# Patient Record
Sex: Female | Born: 1955
Health system: Southern US, Community
[De-identification: ages and names within clinical notes are randomized; demographics above are authoritative.]

## PROBLEM LIST (undated history)

## (undated) DIAGNOSIS — J40 Bronchitis, not specified as acute or chronic: Secondary | ICD-10-CM

## (undated) DIAGNOSIS — C801 Malignant (primary) neoplasm, unspecified: Secondary | ICD-10-CM

## (undated) DIAGNOSIS — D0510 Intraductal carcinoma in situ of unspecified breast: Secondary | ICD-10-CM

## (undated) DIAGNOSIS — I10 Essential (primary) hypertension: Secondary | ICD-10-CM

## (undated) DIAGNOSIS — Q210 Ventricular septal defect: Secondary | ICD-10-CM

## (undated) DIAGNOSIS — I519 Heart disease, unspecified: Secondary | ICD-10-CM

## (undated) DIAGNOSIS — L509 Urticaria, unspecified: Secondary | ICD-10-CM

## (undated) HISTORY — DX: Heart disease, unspecified: I51.9

## (undated) HISTORY — PX: BREAST SURGERY: SHX581

## (undated) HISTORY — DX: Bronchitis, not specified as acute or chronic: J40

## (undated) HISTORY — DX: Urticaria, unspecified: L50.9

## (undated) HISTORY — PX: CARDIAC CATHETERIZATION: SHX172

## (undated) HISTORY — PX: MOUTH SURGERY: SHX715

## (undated) HISTORY — DX: Ventricular septal defect: Q21.0

## (undated) HISTORY — DX: Malignant (primary) neoplasm, unspecified: C80.1

## (undated) HISTORY — DX: Intraductal carcinoma in situ of unspecified breast: D05.10

## (undated) HISTORY — DX: Essential (primary) hypertension: I10

---

## 1998-08-28 ENCOUNTER — Other Ambulatory Visit: Admission: RE | Admit: 1998-08-28 | Discharge: 1998-08-28 | Payer: Self-pay | Admitting: Obstetrics and Gynecology

## 2000-09-02 ENCOUNTER — Ambulatory Visit (HOSPITAL_COMMUNITY)
Admission: RE | Admit: 2000-09-02 | Discharge: 2000-09-02 | Payer: Self-pay | Admitting: Physical Medicine and Rehabilitation

## 2000-09-02 ENCOUNTER — Encounter: Payer: Self-pay | Admitting: Physical Medicine and Rehabilitation

## 2000-10-18 ENCOUNTER — Encounter: Admission: RE | Admit: 2000-10-18 | Discharge: 2000-12-16 | Payer: Self-pay | Admitting: Orthopedic Surgery

## 2001-04-28 ENCOUNTER — Other Ambulatory Visit: Admission: RE | Admit: 2001-04-28 | Discharge: 2001-04-28 | Payer: Self-pay | Admitting: Gynecology

## 2002-01-17 ENCOUNTER — Encounter: Payer: Self-pay | Admitting: Family Medicine

## 2002-01-17 ENCOUNTER — Encounter: Admission: RE | Admit: 2002-01-17 | Discharge: 2002-01-17 | Payer: Self-pay | Admitting: Family Medicine

## 2002-01-26 HISTORY — PX: TOTAL HIP ARTHROPLASTY: SHX124

## 2003-04-10 ENCOUNTER — Encounter: Admission: RE | Admit: 2003-04-10 | Discharge: 2003-04-10 | Payer: Self-pay | Admitting: Family Medicine

## 2006-06-11 ENCOUNTER — Other Ambulatory Visit: Admission: RE | Admit: 2006-06-11 | Discharge: 2006-06-11 | Payer: Self-pay | Admitting: Gynecology

## 2009-01-26 DIAGNOSIS — D0510 Intraductal carcinoma in situ of unspecified breast: Secondary | ICD-10-CM

## 2009-01-26 HISTORY — DX: Intraductal carcinoma in situ of unspecified breast: D05.10

## 2009-07-18 ENCOUNTER — Other Ambulatory Visit: Admission: RE | Admit: 2009-07-18 | Discharge: 2009-07-18 | Payer: Self-pay | Admitting: Gynecology

## 2009-07-18 ENCOUNTER — Ambulatory Visit: Payer: Self-pay | Admitting: Gynecology

## 2009-11-14 ENCOUNTER — Ambulatory Visit (HOSPITAL_COMMUNITY): Admission: RE | Admit: 2009-11-14 | Discharge: 2009-11-14 | Payer: Self-pay | Admitting: General Surgery

## 2009-12-10 ENCOUNTER — Ambulatory Visit: Payer: Self-pay | Admitting: Oncology

## 2010-04-09 LAB — COMPREHENSIVE METABOLIC PANEL
ALT: 18 U/L (ref 0–35)
AST: 17 U/L (ref 0–37)
Albumin: 4.2 g/dL (ref 3.5–5.2)
Alkaline Phosphatase: 67 U/L (ref 39–117)
BUN: 12 mg/dL (ref 6–23)
CO2: 28 mEq/L (ref 19–32)
Calcium: 9.7 mg/dL (ref 8.4–10.5)
Chloride: 104 mEq/L (ref 96–112)
Creatinine, Ser: 0.74 mg/dL (ref 0.4–1.2)
GFR calc Af Amer: >60 mL/min (ref >60)
GFR calc non Af Amer: >60 mL/min (ref >60)
Glucose, Bld: 92 mg/dL (ref 70–99)
Potassium: 4.1 mEq/L (ref 3.5–5.1)
Sodium: 141 mEq/L (ref 135–145)
Total Bilirubin: 0.7 mg/dL (ref 0.3–1.2)
Total Protein: 7.2 g/dL (ref 6.0–8.3)

## 2010-04-09 LAB — CBC
HCT: 42.7 % (ref 36.0–46.0)
Hemoglobin: 14.2 g/dL (ref 12.0–15.0)
MCH: 28.5 pg (ref 26.0–34.0)
MCHC: 33.3 g/dL (ref 30.0–36.0)
MCV: 85.7 fL (ref 78.0–100.0)
Platelets: 232 10*3/uL (ref 150–400)
RBC: 4.98 MIL/uL (ref 3.87–5.11)
RDW: 14.5 % (ref 11.5–15.5)
WBC: 8.2 10*3/uL (ref 4.0–10.5)

## 2010-04-09 LAB — DIFFERENTIAL
Basophils Absolute: 0 10*3/uL (ref 0.0–0.1)
Lymphocytes Relative: 33 % (ref 12–46)
Monocytes Absolute: 0.7 10*3/uL (ref 0.1–1.0)
Monocytes Relative: 9 % (ref 3–12)
Neutro Abs: 4.4 10*3/uL (ref 1.7–7.7)

## 2010-04-09 LAB — SURGICAL PCR SCREEN
MRSA, PCR: NEGATIVE
Staphylococcus aureus: NEGATIVE

## 2010-04-09 LAB — PROTIME-INR: INR: 0.97 (ref 0.00–1.49)

## 2010-07-15 ENCOUNTER — Encounter (INDEPENDENT_AMBULATORY_CARE_PROVIDER_SITE_OTHER): Payer: Self-pay | Admitting: General Surgery

## 2010-07-21 ENCOUNTER — Encounter: Payer: Self-pay | Admitting: Gynecology

## 2010-07-23 ENCOUNTER — Encounter: Payer: Self-pay | Admitting: Gynecology

## 2010-07-29 ENCOUNTER — Other Ambulatory Visit: Payer: Self-pay | Admitting: Gynecology

## 2010-07-29 ENCOUNTER — Encounter (INDEPENDENT_AMBULATORY_CARE_PROVIDER_SITE_OTHER): Payer: PRIVATE HEALTH INSURANCE | Admitting: Gynecology

## 2010-07-29 ENCOUNTER — Other Ambulatory Visit (HOSPITAL_COMMUNITY)
Admission: RE | Admit: 2010-07-29 | Discharge: 2010-07-29 | Disposition: A | Discharge status: Home / Self Care | Payer: PRIVATE HEALTH INSURANCE | Source: Ambulatory Visit | Attending: Gynecology | Admitting: Gynecology

## 2010-07-29 DIAGNOSIS — Z124 Encounter for screening for malignant neoplasm of cervix: Secondary | ICD-10-CM | POA: Insufficient documentation

## 2010-07-29 DIAGNOSIS — B373 Candidiasis of vulva and vagina: Secondary | ICD-10-CM

## 2010-07-29 DIAGNOSIS — R82998 Other abnormal findings in urine: Secondary | ICD-10-CM

## 2010-07-29 DIAGNOSIS — Z01419 Encounter for gynecological examination (general) (routine) without abnormal findings: Secondary | ICD-10-CM

## 2010-08-04 ENCOUNTER — Encounter (INDEPENDENT_AMBULATORY_CARE_PROVIDER_SITE_OTHER): Payer: Self-pay | Admitting: General Surgery

## 2010-08-06 ENCOUNTER — Encounter (INDEPENDENT_AMBULATORY_CARE_PROVIDER_SITE_OTHER): Payer: Self-pay | Admitting: General Surgery

## 2010-08-06 ENCOUNTER — Other Ambulatory Visit (INDEPENDENT_AMBULATORY_CARE_PROVIDER_SITE_OTHER): Payer: PRIVATE HEALTH INSURANCE

## 2010-08-06 ENCOUNTER — Ambulatory Visit (INDEPENDENT_AMBULATORY_CARE_PROVIDER_SITE_OTHER): Payer: PRIVATE HEALTH INSURANCE | Admitting: General Surgery

## 2010-08-06 DIAGNOSIS — D0500 Lobular carcinoma in situ of unspecified breast: Secondary | ICD-10-CM | POA: Insufficient documentation

## 2010-08-06 DIAGNOSIS — D0590 Unspecified type of carcinoma in situ of unspecified breast: Secondary | ICD-10-CM

## 2010-08-06 DIAGNOSIS — R799 Abnormal finding of blood chemistry, unspecified: Secondary | ICD-10-CM

## 2010-08-06 NOTE — Progress Notes (Signed)
She is status post right lumpectomy for lobular carcinoma in situ. I referred her over to the cancer center to the high-risk breast cancer clinic back in October 2011.  However she did not keep her appointment. She did not understand what they were going to tell her. She states that they kept asking her questions about her insurance. She called and made a followup appointment with [place].  She states she does not feel any masses in her breast. Occasionally she has some discomfort at the right breast scar site.  Exam: Right breast is soft with a well-healed upper outer quadrant scar. No dominant masses, nipple discharge, or suspicious skin changes.  The left breast is soft without dominant masses, suspicious skin changes, or nipple discharge.  Nodes: No palpable cervical, supraclavicular, or axillary adenopathy.  Assessment: Lobular carcinoma in situ of right breast. This places her into the high risk group for breast cancer. I explained the reason for referring her over to that clinic. She seems to understand.  Plan: Referral to high-risk breast cancer clinic. Return visit here p.r.n.

## 2010-08-06 NOTE — Patient Instructions (Signed)
Keep appointment with Oncology clinic.

## 2010-09-04 ENCOUNTER — Ambulatory Visit (INDEPENDENT_AMBULATORY_CARE_PROVIDER_SITE_OTHER): Payer: PRIVATE HEALTH INSURANCE | Admitting: Gynecology

## 2010-09-04 ENCOUNTER — Other Ambulatory Visit: Payer: Self-pay

## 2010-09-04 DIAGNOSIS — Z13820 Encounter for screening for osteoporosis: Secondary | ICD-10-CM

## 2010-09-05 ENCOUNTER — Other Ambulatory Visit: Payer: PRIVATE HEALTH INSURANCE

## 2010-10-14 ENCOUNTER — Encounter: Payer: Self-pay | Admitting: Gynecology

## 2011-01-01 ENCOUNTER — Ambulatory Visit: Payer: PRIVATE HEALTH INSURANCE

## 2011-01-01 ENCOUNTER — Ambulatory Visit (HOSPITAL_BASED_OUTPATIENT_CLINIC_OR_DEPARTMENT_OTHER): Payer: PRIVATE HEALTH INSURANCE | Admitting: Family

## 2011-01-01 ENCOUNTER — Telehealth: Payer: Self-pay | Admitting: *Deleted

## 2011-01-01 DIAGNOSIS — D0590 Unspecified type of carcinoma in situ of unspecified breast: Secondary | ICD-10-CM

## 2011-01-01 DIAGNOSIS — D6859 Other primary thrombophilia: Secondary | ICD-10-CM

## 2011-01-01 DIAGNOSIS — D0500 Lobular carcinoma in situ of unspecified breast: Secondary | ICD-10-CM

## 2011-01-01 DIAGNOSIS — Z803 Family history of malignant neoplasm of breast: Secondary | ICD-10-CM

## 2011-01-01 DIAGNOSIS — E669 Obesity, unspecified: Secondary | ICD-10-CM

## 2011-01-01 NOTE — Telephone Encounter (Signed)
gave patient appointment for Rebecca Stephens 01-05-2011 at 10:30am, gave patient appointment for three months with nancy rudolph 04-02-2011 printed out calendar and gave to the patient

## 2011-01-01 NOTE — Progress Notes (Signed)
[facility]  High Risk Clinic New Patient Evaluation  Name: Rebecca Stephens            Date: 01/01/2011 MRN: [id number]                DOB: 03/12/1955  CC: Catha Gosselin, MD        Avel Peace, MD  REFERRING PHYSICIAN: Avel Peace, MD  REASON FOR VISIT: Assessment for high risk, breast cancer.   HISTORY OF PRESENT ILLNESS: Rebecca Stephens is a 55 y.o. female here for cancer risk assessment. She presents to the clinic with for counseling and evaluation and discussion regarding the implication for her own healthcare and for her family. Previous breast/ovarian biopsies: Core needle biopsy 09/2009, right breast: Complex sclerosing lesion with hyperplasia and focal atypia and lobular carcinoma in situ. Surgical excision 10/2009, right breast: Focal atypical ductal hyperplasia with radial scar, no carcinoma.   PAST MEDICAL HISTORY: Congenital ventricular septal defect, required observation only. Superficial thrombophlebitis while on birth control age 18. No prior thoracic irradiation.   PAST SURGICAL HISTORY:  Procedure Date  . Total hip arthroplasty 2004    right hip  . Cardiac catheterization   . Mouth surgery     bone removed from under tongue  . Breast surgery 2011    Right lumpectomy: lobular carcinoma in situ, completely excised.       CURRENT MEDICATIONS: Aspirin 81 mg. daily, Fish Oil capsule daily, Advair, 2 puffs daily.   ALLERGIES: No known allergies.  SOCIAL HISTORY: Never smoked. Married, 2 children, youngest will attend college this year. 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   HEALTH HABITS: Supplements: Fish Oil  Alternative Therapies: None Adverse environmental exposure: None Exercises regularly: no Nonsmoker Alcohol yes, 2 glasses wine/week BMI 38.  REPRODUCTIVE HISTORY:  Menarche age 32 Grav 3 Para 3 FLB age 45 Number of live births: 2 Took fertility meds: No Menses: absent for 1 year Oral Contraceptives:   Yes, age 60 for 4 months Menopause: natural  Age  23 HRT: No, never  ETHNICITY: [place], [place], [place].    FAMILY HISTORY: Breast cancer in maternal grandmother; colon cancer in maternal grandfather. Clotting disorder in her father; and stroke or MI in her paternal grandfather and paternal grandmother. Maternal grandfather with sarcoma, lower extremity. No ovarian cancer. No family history of thyroid cancer, adrenocortical carcinoma, endometrial cancer, pancreatic cancer, brain tumors, diffuse gastric cancer, leukemia/lymphoma. No female breast cancer.   HEALTH MAINTENANCE: Last mammogram: Dec. 2011 Last clinical breast exam: 6 months ago Performs self breast exam: No Colonoscopy: August, 2011: Tubular adenoma and hyperplastic polyp, no high grade dysplasia or malignancy.   REVIEW OF SYSTEMS: General: Negative for fever, chills, night sweats,  loss of appetite or weight loss. HEENT: Negative for headaches, sore  throat, difficulty swallowing, blurred vision or problem with hearing or  sinus congestion. Respiratory: Negative for shortness of breath, cough  or dyspnea on exertion. Cardiovascular: Negative for chest pain,  palpitations or pedal edema. GI: Negative for nausea, vomiting,  diarrhea, constipation, change in bowel habits or blood in the stool.  No jaundice. GU: Negative for painful or frequent urination, change in  color of urine, or decreased urinary stream. Integumentary: Negative  for skin rashes or other suspicious skin lesions. Hematologic: Negative  for easy bruisability or bleeding. Musculoskeletal: Negative for  complaints of pain, arthralgias, arthritis or myalgias.  Neurological/psychiatric: Negative for numbness, focal weakness,  balance problems or coordination difficulties. No  depression or anxiety. Breast: No self-detected breast complaints.   PHYSICAL EXAM: Wt: 256, B/P 126/79, P 71, T 97.8 GENERAL: Well developed, well nourished, in no acute distress.  EENT:  No ocular or oral lesions. No stomatitis.  RESPIRATORY: Lungs are clear to auscultation bilaterally with normal respiratory movement and no accessory muscle use. CARDIAC: No murmur, rub or tachycardia. No upper or lower extremity edema.  GI: Abdomen is soft, no palpable hepatosplenomegaly. No fluid wave. No tenderness. Musculoskeletal: No kyphosis, no tenderness over the spine, ribs or hips. Lymph: No cervical, infraclavicular, axillary or inguinal adenopathy. Breast: In the supine position, with the right arm over the head, the right nipple is everted. No periareolar edema or nipple discharge. No mass in any quadrant or subareolar region. No redness of the skin. No right axillary adenopathy. Well healted lumpectomy scar at the 9:30 position. No nodularity underlying the scar. With the left arm over the head, the left nipple is everted. No periareolar edema or nipple discharge. No mass in any quadrant or subareolar region. No redness of the skin. No left axillary adenopathy. Prominent inframammary folds bilaterally.  Neuro: No focal neurological deficits. Psych: Alert and oriented X 3, appropriate mood and affect.   ASSESSMENT: 55 y/o white female with: 1. History of right breast lobular carcinoma in situ, Dec. 2011, no evidence of recurrence.  2. Hypercoagulable state with history of superficial thrombophlebitis while on birth control, age 34.  3. Obesity. 4. Due mammography.  PLAN:  1. Mammogram at [facility] (per her request). 2. Lab work (hypercoagulable panel). 3. Nutrition consult for weight loss to reduce breast cancer risks. 4. She will follow-up with cardiology for ventricular septal defect.  5. Return to clinic in 3 months. By that time we will have results of hypercoagulable work-up and we can use that information to weigh risks vs benefits of chemoprevention.   TEACHING: She is insecure about how to perform self breast exam. A detailed procedure is outlined for her with demonstration  and she performs a return demonstration.   DISCUSSION: Risk reduction with the use of chemoprevention is discussed using Tamoxifen or Raloxifene. The possible side effects of both agents were discussed, including hot flashes, vaginal dryness and increased risk of DVT. Increased risk of endometrial cancer and cataracts with Tamoxifen were discussed. The benefits to bone density were outlined for both agents.   Lifestyle modifications such as diet, body weight, exercise and alcohol consumption were discussed. A healthy lifestyle was encouraged and up-todate recommendations for screening and surveillance were stressed, along with the importance of screening for other common cancers (cervical, skin, colon).   A total of 125 minutes was spent with the patient. 100 minutes were spent in counseling regarding risks/benefits of treatments and lifestyle modifications.

## 2011-01-05 ENCOUNTER — Ambulatory Visit: Payer: PRIVATE HEALTH INSURANCE | Admitting: Nutrition

## 2011-01-05 NOTE — Assessment & Plan Note (Signed)
REASON FOR ASSESSMENT:  This is a 55 year old female patient of Dr. Welton Flakes diagnosed with DCIS last year.  MEDICAL HISTORY INCLUDES: 1. Congenital ventricular septal defect. 2. Obesity. 3. Hypertension. 4. Asthma. 5. Bronchitis. 6. Total hip replacement in 2004.  MEDICATIONS INCLUDE:  Aspirin and fish oil.  LABS:  There are no recent labs to review.  HEIGHT:  70 inches. WEIGHT:  256 pounds. BMI:  36.7  PATIENT STATES:  The patient is interested in nutrition information to help reduce her chance of breast cancer recurrence.  NUTRITION DIAGNOSIS:  Food and nutrition related knowledge deficit related to history of breast cancer as evidenced by no prior need for nutrition related information.  INTERVENTION:  I have educated the patient on the importance of increasing a plant-based diet and eating lean protein sources while decreasing total fat in her diet as well as processed foods.  I have encouraged the patient to begin an exercise regimen based on her physician recommendations and eventually to increase to a minimum of 30 minutes, 5 days a week for a total of 150 minutes a week and gradually increase that to 300 minutes a week based on tolerance.  I have encouraged the patient to specifically choose lower fat foods, to decrease overall sugar content of her meal choices, to increase whole fruits and vegetables to approximately 7 servings daily, and to include lean protein sources.  I have provided her with fact sheets and information for her to review on her own.  I have given her some on-line websites for her to do some research on nutrition and to possibly use them to help her in her weight loss journey.  MONITORING/EVALUATION (GOALS):  The patient will tolerate a plant-based diet that is low in fat and high in fruits, vegetables, and whole grains to promote safe weight loss and to reduce the risk for breast cancer recurrence.  NEXT VISIT:  I have not scheduled a followup with the patient.   However, she has my contact information for questions or concerns.    ______________________________ Zenovia Jarred, RD, LDN Clinical Nutrition Specialist BN/MEDQ  D:  01/05/2011  T:  01/05/2011  Job:  [id number]

## 2011-01-08 ENCOUNTER — Telehealth: Payer: Self-pay | Admitting: Oncology

## 2011-01-08 LAB — HYPERCOAGULABLE PANEL, COMPREHENSIVE
Anticardiolipin IgG: 16 GPL U/mL (ref <23)
Beta-2 Glyco I IgG: 0 G Units (ref <20)
DRVVT: 37.3 secs (ref 34.1–42.2)
Lupus Anticoagulant: NOT DETECTED
Protein C Activity: 200 % — ABNORMAL HIGH (ref 75–133)
Protein C, Total: 167 % — ABNORMAL HIGH (ref 72–160)
Protein S Total: 130 % (ref 60–150)

## 2011-01-08 NOTE — Telephone Encounter (Signed)
pt rtn call and confirmed appt for 04/01/2011

## 2011-01-13 ENCOUNTER — Encounter (INDEPENDENT_AMBULATORY_CARE_PROVIDER_SITE_OTHER): Payer: Self-pay | Admitting: General Surgery

## 2011-02-10 ENCOUNTER — Encounter: Payer: Self-pay | Admitting: *Deleted

## 2011-02-10 NOTE — Progress Notes (Signed)
Mailed lab results to pt

## 2011-02-13 ENCOUNTER — Telehealth: Payer: Self-pay | Admitting: Oncology

## 2011-02-13 ENCOUNTER — Other Ambulatory Visit: Payer: Self-pay | Admitting: Oncology

## 2011-02-13 DIAGNOSIS — D6859 Other primary thrombophilia: Secondary | ICD-10-CM

## 2011-02-13 NOTE — Telephone Encounter (Signed)
No note, telephone call only

## 2011-02-14 ENCOUNTER — Telehealth: Payer: Self-pay | Admitting: Oncology

## 2011-02-14 NOTE — Telephone Encounter (Signed)
lmonvm advising the pt of her lab appt on Tuesday 02/17/2011

## 2011-02-17 ENCOUNTER — Other Ambulatory Visit: Payer: PRIVATE HEALTH INSURANCE | Admitting: Lab

## 2011-04-01 ENCOUNTER — Ambulatory Visit: Payer: PRIVATE HEALTH INSURANCE | Admitting: Family

## 2011-04-02 ENCOUNTER — Ambulatory Visit: Payer: PRIVATE HEALTH INSURANCE | Admitting: Family

## 2011-07-16 ENCOUNTER — Encounter (INDEPENDENT_AMBULATORY_CARE_PROVIDER_SITE_OTHER): Payer: Self-pay

## 2011-11-18 ENCOUNTER — Telehealth (INDEPENDENT_AMBULATORY_CARE_PROVIDER_SITE_OTHER): Payer: Self-pay

## 2011-11-18 NOTE — Telephone Encounter (Signed)
Pt called for information from her pathology of 11/19/2009.  Results were read to her.

## 2011-12-10 ENCOUNTER — Other Ambulatory Visit (HOSPITAL_COMMUNITY): Payer: Self-pay

## 2011-12-10 ENCOUNTER — Other Ambulatory Visit (HOSPITAL_COMMUNITY): Payer: Self-pay | Admitting: *Deleted

## 2011-12-10 DIAGNOSIS — Q210 Ventricular septal defect: Secondary | ICD-10-CM

## 2011-12-10 DIAGNOSIS — R079 Chest pain, unspecified: Secondary | ICD-10-CM

## 2011-12-16 ENCOUNTER — Inpatient Hospital Stay (HOSPITAL_COMMUNITY): Admission: RE | Admit: 2011-12-16 | Payer: BC Managed Care – PPO | Source: Ambulatory Visit

## 2011-12-16 ENCOUNTER — Ambulatory Visit (HOSPITAL_COMMUNITY)
Admission: RE | Admit: 2011-12-16 | Discharge: 2011-12-16 | Disposition: A | Discharge status: Home / Self Care | Payer: BC Managed Care – PPO | Source: Ambulatory Visit | Attending: Cardiovascular Disease | Admitting: Cardiovascular Disease

## 2011-12-16 DIAGNOSIS — R002 Palpitations: Secondary | ICD-10-CM | POA: Insufficient documentation

## 2011-12-16 DIAGNOSIS — I079 Rheumatic tricuspid valve disease, unspecified: Secondary | ICD-10-CM | POA: Insufficient documentation

## 2011-12-16 DIAGNOSIS — I510 Cardiac septal defect, acquired: Secondary | ICD-10-CM | POA: Insufficient documentation

## 2011-12-16 DIAGNOSIS — Q210 Ventricular septal defect: Secondary | ICD-10-CM

## 2011-12-16 NOTE — Progress Notes (Signed)
[facility]   2D echo completed 12/16/2011.   Cindy Analissa Bayless, RDCS   

## 2011-12-30 ENCOUNTER — Ambulatory Visit (HOSPITAL_COMMUNITY)
Admission: RE | Admit: 2011-12-30 | Discharge: 2011-12-30 | Disposition: A | Discharge status: Home / Self Care | Payer: BC Managed Care – PPO | Source: Ambulatory Visit | Attending: Cardiovascular Disease | Admitting: Cardiovascular Disease

## 2011-12-30 DIAGNOSIS — Z8249 Family history of ischemic heart disease and other diseases of the circulatory system: Secondary | ICD-10-CM | POA: Insufficient documentation

## 2011-12-30 DIAGNOSIS — R079 Chest pain, unspecified: Secondary | ICD-10-CM | POA: Insufficient documentation

## 2011-12-30 DIAGNOSIS — J45909 Unspecified asthma, uncomplicated: Secondary | ICD-10-CM | POA: Insufficient documentation

## 2011-12-30 DIAGNOSIS — E669 Obesity, unspecified: Secondary | ICD-10-CM | POA: Insufficient documentation

## 2011-12-30 MED ORDER — REGADENOSON 0.4 MG/5ML IV SOLN
0.4000 mg | Freq: Once | INTRAVENOUS | Status: AC
  Administered 2011-12-30: 0.4 mg via INTRAVENOUS

## 2011-12-30 MED ORDER — AMINOPHYLLINE 25 MG/ML IV SOLN
75.0000 mg | Freq: Once | INTRAVENOUS | Status: AC
  Administered 2011-12-30: 75 mg via INTRAVENOUS

## 2011-12-30 MED ORDER — TECHNETIUM TC 99M SESTAMIBI GENERIC - CARDIOLITE
10.5000 | Freq: Once | INTRAVENOUS | Status: AC | PRN
  Administered 2011-12-30: 11 via INTRAVENOUS

## 2011-12-30 MED ORDER — TECHNETIUM TC 99M SESTAMIBI GENERIC - CARDIOLITE
31.0000 | Freq: Once | INTRAVENOUS | Status: AC | PRN
  Administered 2011-12-30: 31 via INTRAVENOUS

## 2011-12-30 NOTE — Procedures (Addendum)
[facility] [address] [place] [address] [place] [address] [phone]  Cardiology Nuclear Med Study  Rebecca Stephens is a 56 y.o. female     MRN : [id number]     DOB: 02-11-1955  Procedure Date: 12/30/2011  Nuclear Med Background Indication for Stress Test:  Evaluation for Ischemia History:  Asthma Cardiac Risk Factors: Family History - CAD and Obesity  Symptoms:  Chest Pain   Nuclear Pre-Procedure Caffeine/Decaff Intake:  9:00pm NPO After: 7:00am   IV Site: R Antecubital  IV 0.9% NS with Angio Cath:  22g  Chest Size (in):  n/a IV Started by: Koren Shiver, CNMT  Height: 5\' 11"  (1.803 m)  Cup Size: B  BMI:  Body mass index is 37.24 kg/(m^2). Weight:  267 lb (121.11 kg)   Tech Comments:  n/a    Nuclear Med Study 1 or 2 day study: 1 day  Stress Test Type:  Lexiscan  Order Authorizing Provider:  Nicki Guadalajara, MD   Resting Radionuclide: Technetium 18m Sestamibi  Resting Radionuclide Dose: 10.5 mCi   Stress Radionuclide:  Technetium 62m Sestamibi  Stress Radionuclide Dose: 31.0 mCi           Stress Protocol Rest HR: 59 Stress HR:98  Rest BP:137/88 Stress BP: 153/76  Exercise Time (min): n/a METS: n/a          Dose of Adenosine (mg):  n/a Dose of Lexiscan: 0.4 mg  Dose of Atropine (mg): n/a Dose of Dobutamine: n/a mcg/kg/min (at max HR)  Stress Test Technologist: Ernestene Mention, CCT Nuclear Technologist: Gonzella Lex, CNMT   Rest Procedure:  Myocardial perfusion imaging was performed at rest 45 minutes following the intravenous administration of Technetium 42m Sestamibi. Stress Procedure:  The patient received IV Lexiscan 0.4 mg over 15-seconds.  Technetium 25m Sestamibi injected at 30-seconds.  Patient was given 75 mg of aminophylline through her IV because of the symptoms that she had when the Lexiscan was administered. Those symptoms were shortness of breath, headache and nausea. There were no significant changes with  Lexiscan.  Quantitative spect images were obtained after a 45 minute delay.  Transient Ischemic Dilatation (Normal <1.22): 1.0  Lung/Heart Ratio (Normal <0.45):  0.24 QGS EDV:  119 ml QGS ESV:  42 ml LV Ejection Fraction: 65%  Signed by       Rest ECG: NSR - Normal EKG  Stress ECG: No significant change from baseline ECG  QPS Raw Data Images:  Normal; no motion artifact; normal heart/lung ratio. Stress Images:  There is decreased uptake in the anterior wall. Rest Images:  There is decreased uptake in the anterior wall. Subtraction (SDS):  There is a fixed anteriour defect that is most consistent with breast attenuation.  Impression Exercise Capacity:  Lexiscan with no exercise. BP Response:  Normal blood pressure response. Clinical Symptoms:  No significant symptoms noted. ECG Impression:  No significant ST segment change suggestive of ischemia. Comparison with Prior Nuclear Study: No significant change from previous study  Overall Impression:  Low risk stress nuclear study. and When compared to prior study performed there has been no significant There is moderate breast attenuation artifact..  LV Wall Motion:  NL LV Function; NL Wall Motion   Runell Gess, MD  12/30/2011 5:32 PM

## 2012-04-12 IMAGING — CR DG CHEST 2V
2 series · 2 of 2 positions shown · non-contrast
Comparison: 04/10/2003

CLINICAL DATA: Preoperative respiratory examination for right
breast surgery.

CHEST - 2 VIEW

[view not recorded (1 of 2)]
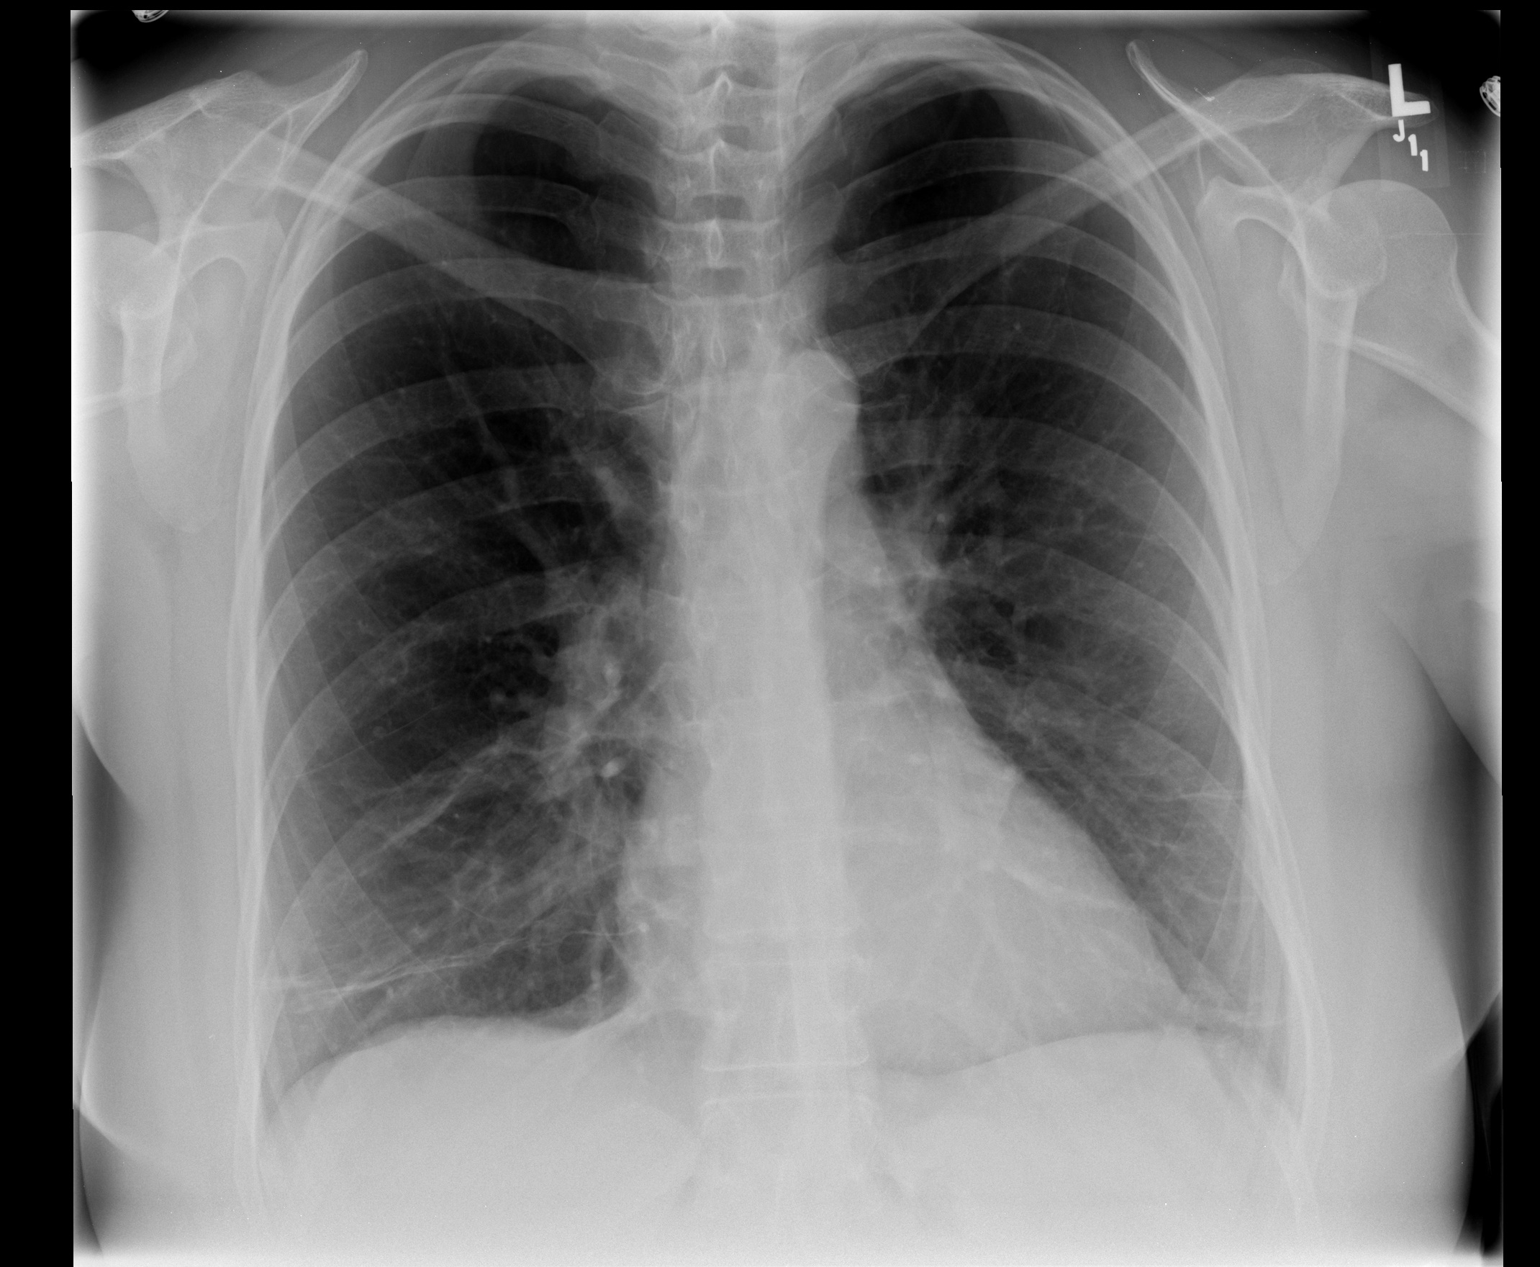

[view not recorded (2 of 2)]
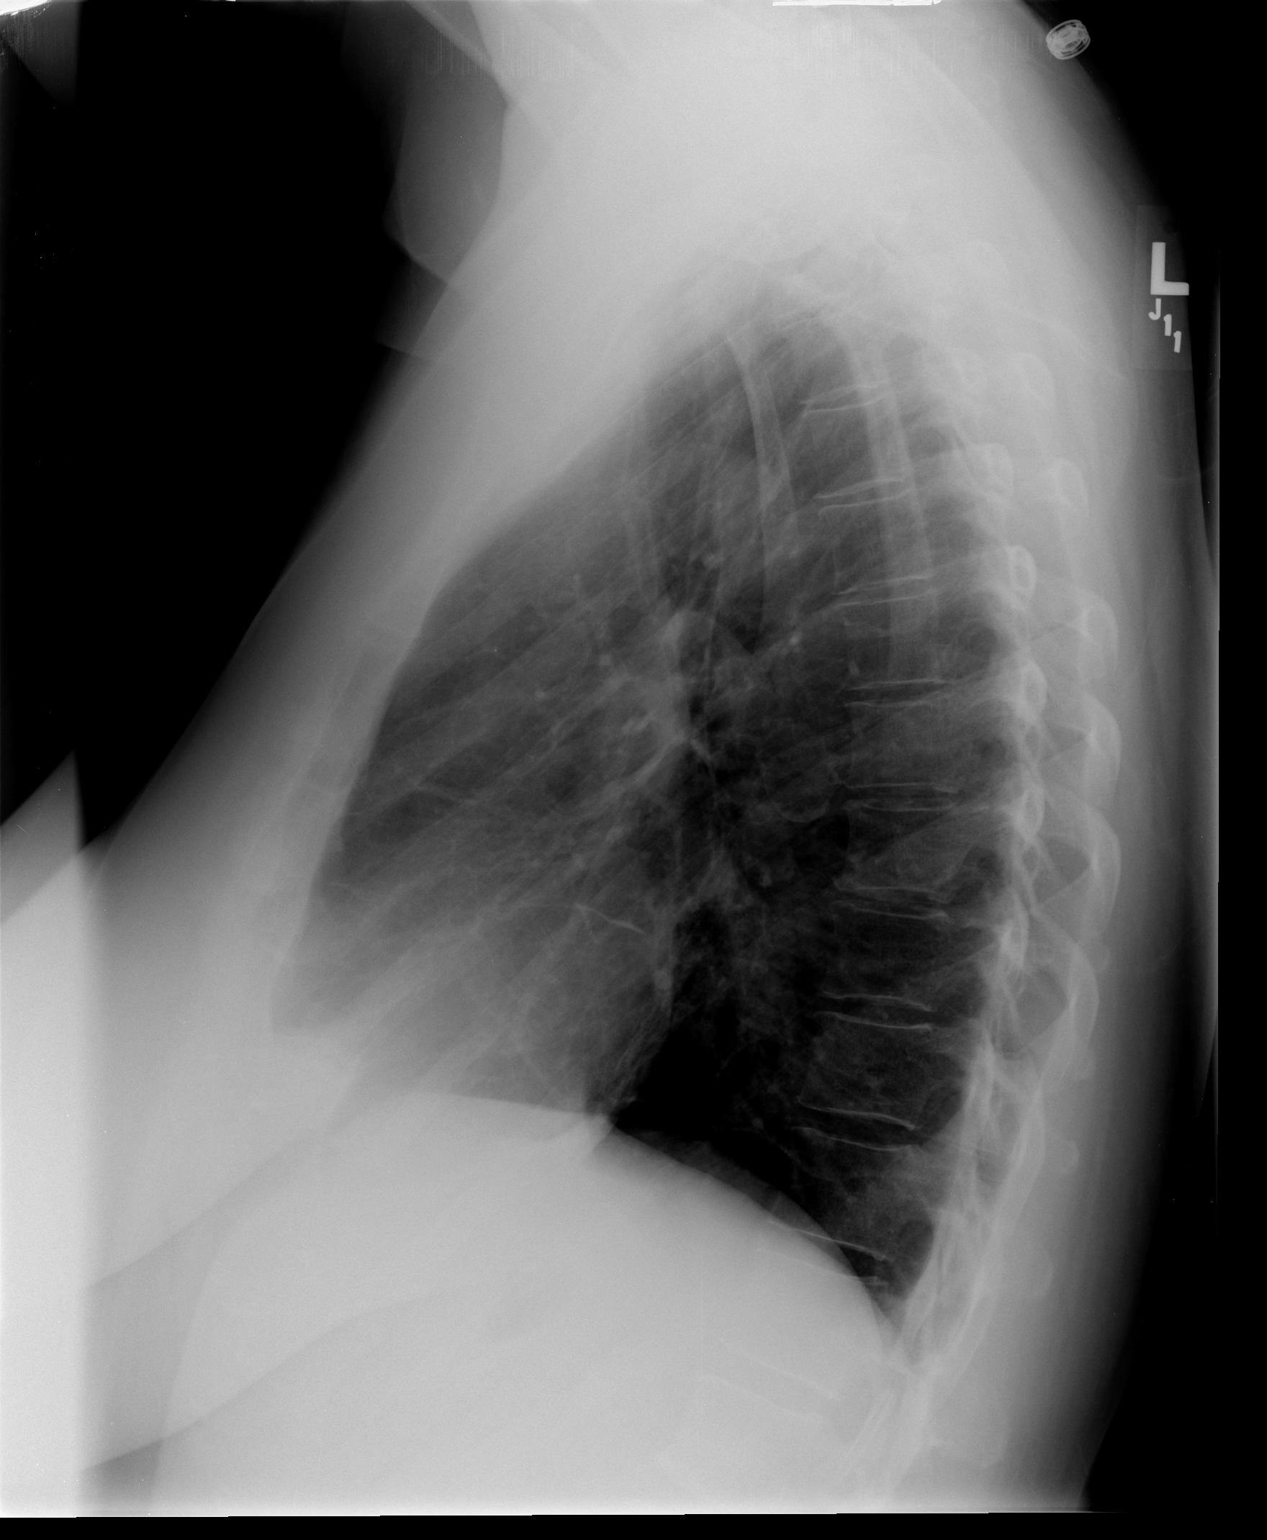

[2 of 2 positions shown; findings below may reference images not displayed]

FINDINGS: The cardiomediastinal silhouette is unremarkable.
Hyperinflation, mild peribronchial thickening and mild basilar
scarring again noted.
There is no evidence of focal airspace disease, pulmonary edema,
pleural effusion, or pneumothorax.
No acute bony abnormalities are identified.
IMPRESSION: No evidence of active cardiopulmonary disease.

Hyperinflation with mild chronic peribronchial thickening and mild
basilar scarring.

## 2012-05-19 ENCOUNTER — Encounter: Payer: Self-pay | Admitting: Cardiovascular Disease

## 2012-05-26 ENCOUNTER — Ambulatory Visit (INDEPENDENT_AMBULATORY_CARE_PROVIDER_SITE_OTHER): Payer: BC Managed Care – PPO | Admitting: Gynecology

## 2012-05-26 ENCOUNTER — Encounter: Payer: Self-pay | Admitting: Gynecology

## 2012-05-26 VITALS — BP 122/78 | Ht 70.0 in | Wt 240.0 lb

## 2012-05-26 DIAGNOSIS — Z01419 Encounter for gynecological examination (general) (routine) without abnormal findings: Secondary | ICD-10-CM

## 2012-05-26 DIAGNOSIS — C50919 Malignant neoplasm of unspecified site of unspecified female breast: Secondary | ICD-10-CM

## 2012-05-26 DIAGNOSIS — C50911 Malignant neoplasm of unspecified site of right female breast: Secondary | ICD-10-CM

## 2012-05-26 NOTE — Patient Instructions (Signed)
Followup for annual exam in one year. Report any vaginal bleeding.

## 2012-05-26 NOTE — Progress Notes (Signed)
Rebecca Stephens Sep 29, 1955 [id number]        57 y.o.  [id number] for annual exam.  Doing well without complaints.  Past medical history,surgical history, medications, allergies, family history and social history were all reviewed and documented in the EPIC chart. ROS:  Was performed and pertinent positives and negatives are included in the history.  Exam: [name] assistant Filed Vitals:   05/26/12 1402  BP: 122/78  Height: 5\' 10"  (1.778 m)  Weight: 240 lb (108.863 kg)   General appearance  Normal Skin grossly normal Head/Neck normal with no cervical or supraclavicular adenopathy thyroid normal Lungs  clear Cardiac RR, harsh pansystolic murmur Abdominal  soft, nontender, without masses, organomegaly or hernia Breasts  examined lying and sitting without masses, retractions, discharge or axillary adenopathy. Pelvic  Ext/BUS/vagina  normal   Cervix  normal   Uterus  anteverted, normal size, shape and contour, midline and mobile nontender   Adnexa  Without masses or tenderness    Anus and perineum  normal   Rectovaginal  normal sphincter tone without palpated masses or tenderness.    Assessment/Plan:  57 y.o. [id number] female for annual exam.   1. Postmenopausal. Doing well without significant hot flashes night sweats or vaginal dryness. We'll continue to monitor. 2. DCIS right breast. Is on tamoxifen doing well. Mammogram 12/2011. Continue with annual mammography and her oncology followup. SBE monthly review. Patient knows to report any vaginal bleeding. 3. Pap smear 07/2010. No Pap smear done today. No history of abnormal Pap smears previously. Plan repeat Pap smear next year a 3 year interval. 4. Colonoscopy 4 years ago. Repeat at their recommended interval. 5. DEXA 08/2010 normal. Had heel DEXA done at work this year which was normal. Recommend repeat DEXA at age 33. Increase calcium vitamin D reviewed. 6. Health maintenance. No blood work done as it is all done through her primary  physician's office. Followup one year, sooner as needed.    [name], 2:28 PM 05/26/2012

## 2013-05-30 ENCOUNTER — Other Ambulatory Visit (HOSPITAL_COMMUNITY)
Admission: RE | Admit: 2013-05-30 | Discharge: 2013-05-30 | Disposition: A | Discharge status: Home / Self Care | Payer: BC Managed Care – PPO | Source: Ambulatory Visit | Attending: Gynecology | Admitting: Gynecology

## 2013-05-30 ENCOUNTER — Encounter: Payer: Self-pay | Admitting: Gynecology

## 2013-05-30 ENCOUNTER — Ambulatory Visit (INDEPENDENT_AMBULATORY_CARE_PROVIDER_SITE_OTHER): Payer: BC Managed Care – PPO | Admitting: Gynecology

## 2013-05-30 VITALS — BP 124/80 | Ht 70.0 in | Wt 236.0 lb

## 2013-05-30 DIAGNOSIS — Z01419 Encounter for gynecological examination (general) (routine) without abnormal findings: Secondary | ICD-10-CM | POA: Insufficient documentation

## 2013-05-30 DIAGNOSIS — Z1151 Encounter for screening for human papillomavirus (HPV): Secondary | ICD-10-CM | POA: Insufficient documentation

## 2013-05-30 NOTE — Patient Instructions (Signed)
Followup in one year for annual exam. Call me sooner if you have any issues or problems.  Health Maintenance, Female A healthy lifestyle and preventative care can promote health and wellness.  Maintain regular health, dental, and eye exams.  Eat a healthy diet. Foods like vegetables, fruits, whole grains, low-fat dairy products, and lean protein foods contain the nutrients you need without too many calories. Decrease your intake of foods high in solid fats, added sugars, and salt. Get information about a proper diet from your caregiver, if necessary.  Regular physical exercise is one of the most important things you can do for your health. Most adults should get at least 150 minutes of moderate-intensity exercise (any activity that increases your heart rate and causes you to sweat) each week. In addition, most adults need muscle-strengthening exercises on 2 or more days a week.   Maintain a healthy weight. The body mass index (BMI) is a screening tool to identify possible weight problems. It provides an estimate of body fat based on height and weight. Your caregiver can help determine your BMI, and can help you achieve or maintain a healthy weight. For adults 20 years and older:  A BMI below 18.5 is considered underweight.  A BMI of 18.5 to 24.9 is normal.  A BMI of 25 to 29.9 is considered overweight.  A BMI of 30 and above is considered obese.  Maintain normal blood lipids and cholesterol by exercising and minimizing your intake of saturated fat. Eat a balanced diet with plenty of fruits and vegetables. Blood tests for lipids and cholesterol should begin at age 35 and be repeated every 5 years. If your lipid or cholesterol levels are high, you are over 50, or you are a high risk for heart disease, you may need your cholesterol levels checked more frequently.Ongoing high lipid and cholesterol levels should be treated with medicines if diet and exercise are not effective.  If you smoke, find  out from your caregiver how to quit. If you do not use tobacco, do not start.  Lung cancer screening is recommended for adults aged 74 80 years who are at high risk for developing lung cancer because of a history of smoking. Yearly low-dose computed tomography (CT) is recommended for people who have at least a 30-pack-year history of smoking and are a current smoker or have quit within the past 15 years. A pack year of smoking is smoking an average of 1 pack of cigarettes a day for 1 year (for example: 1 pack a day for 30 years or 2 packs a day for 15 years). Yearly screening should continue until the smoker has stopped smoking for at least 15 years. Yearly screening should also be stopped for people who develop a health problem that would prevent them from having lung cancer treatment.  If you are pregnant, do not drink alcohol. If you are breastfeeding, be very cautious about drinking alcohol. If you are not pregnant and choose to drink alcohol, do not exceed 1 drink per day. One drink is considered to be 12 ounces (355 mL) of beer, 5 ounces (148 mL) of wine, or 1.5 ounces (44 mL) of liquor.  Avoid use of street drugs. Do not share needles with anyone. Ask for help if you need support or instructions about stopping the use of drugs.  High blood pressure causes heart disease and increases the risk of stroke. Blood pressure should be checked at least every 1 to 2 years. Ongoing high blood pressure should  be treated with medicines, if weight loss and exercise are not effective.  If you are 20 to 57 years old, ask your caregiver if you should take aspirin to prevent strokes.  Diabetes screening involves taking a blood sample to check your fasting blood sugar level. This should be done once every 3 years, after age 40, if you are within normal weight and without risk factors for diabetes. Testing should be considered at a younger age or be carried out more frequently if you are overweight and have at least  1 risk factor for diabetes.  Breast cancer screening is essential preventative care for women. You should practice "breast self-awareness." This means understanding the normal appearance and feel of your breasts and may include breast self-examination. Any changes detected, no matter how small, should be reported to a caregiver. Women in their 30s and 30s should have a clinical breast exam (CBE) by a caregiver as part of a regular health exam every 1 to 3 years. After age 55, women should have a CBE every year. Starting at age 62, women should consider having a mammogram (breast X-ray) every year. Women who have a family history of breast cancer should talk to their caregiver about genetic screening. Women at a high risk of breast cancer should talk to their caregiver about having an MRI and a mammogram every year.  Breast cancer gene (BRCA)-related cancer risk assessment is recommended for women who have family members with BRCA-related cancers. BRCA-related cancers include breast, ovarian, tubal, and peritoneal cancers. Having family members with these cancers may be associated with an increased risk for harmful changes (mutations) in the breast cancer genes BRCA1 and BRCA2. Results of the assessment will determine the need for genetic counseling and BRCA1 and BRCA2 testing.  The Pap test is a screening test for cervical cancer. Women should have a Pap test starting at age 42. Between ages 83 and 63, Pap tests should be repeated every 2 years. Beginning at age 33, you should have a Pap test every 3 years as long as the past 3 Pap tests have been normal. If you had a hysterectomy for a problem that was not cancer or a condition that could lead to cancer, then you no longer need Pap tests. If you are between ages 54 and 1, and you have had normal Pap tests going back 10 years, you no longer need Pap tests. If you have had past treatment for cervical cancer or a condition that could lead to cancer, you need  Pap tests and screening for cancer for at least 20 years after your treatment. If Pap tests have been discontinued, risk factors (such as a new sexual partner) need to be reassessed to determine if screening should be resumed. Some women have medical problems that increase the chance of getting cervical cancer. In these cases, your caregiver may recommend more frequent screening and Pap tests.  The human papillomavirus (HPV) test is an additional test that may be used for cervical cancer screening. The HPV test looks for the virus that can cause the cell changes on the cervix. The cells collected during the Pap test can be tested for HPV. The HPV test could be used to screen women aged 27 years and older, and should be used in women of any age who have unclear Pap test results. After the age of 76, women should have HPV testing at the same frequency as a Pap test.  Colorectal cancer can be detected and often prevented. Most  routine colorectal cancer screening begins at the age of 51 and continues through age 49. However, your caregiver may recommend screening at an earlier age if you have risk factors for colon cancer. On a yearly basis, your caregiver may provide home test kits to check for hidden blood in the stool. Use of a small camera at the end of a tube, to directly examine the colon (sigmoidoscopy or colonoscopy), can detect the earliest forms of colorectal cancer. Talk to your caregiver about this at age 67, when routine screening begins. Direct examination of the colon should be repeated every 5 to 10 years through age 34, unless early forms of pre-cancerous polyps or small growths are found.  Hepatitis C blood testing is recommended for all people born from [birth year] through 1964 and any individual with known risks for hepatitis C.  Practice safe sex. Use condoms and avoid high-risk sexual practices to reduce the spread of sexually transmitted infections (STIs). Sexually active women aged 69 and  younger should be checked for Chlamydia, which is a common sexually transmitted infection. Older women with new or multiple partners should also be tested for Chlamydia. Testing for other STIs is recommended if you are sexually active and at increased risk.  Osteoporosis is a disease in which the bones lose minerals and strength with aging. This can result in serious bone fractures. The risk of osteoporosis can be identified using a bone density scan. Women ages 62 and over and women at risk for fractures or osteoporosis should discuss screening with their caregivers. Ask your caregiver whether you should be taking a calcium supplement or vitamin D to reduce the rate of osteoporosis.  Menopause can be associated with physical symptoms and risks. Hormone replacement therapy is available to decrease symptoms and risks. You should talk to your caregiver about whether hormone replacement therapy is right for you.  Use sunscreen. Apply sunscreen liberally and repeatedly throughout the day. You should seek shade when your shadow is shorter than you. Protect yourself by wearing long sleeves, pants, a wide-brimmed hat, and sunglasses year round, whenever you are outdoors.  Notify your caregiver of new moles or changes in moles, especially if there is a change in shape or color. Also notify your caregiver if a mole is larger than the size of a pencil eraser.  Stay current with your immunizations. Document Released: 07/28/2010 Document Revised: 05/09/2012 Document Reviewed: 07/28/2010 [facility] Patient Information 2014 [place].

## 2013-05-30 NOTE — Progress Notes (Signed)
Rebecca Stephens 1955-03-06 [id number]        58 y.o.  [id number] for annual exam.  Doing well without complaints.  Past medical history,surgical history, problem list, medications, allergies, family history and social history were all reviewed and documented as reviewed in the EPIC chart.  ROS:  12 system ROS performed with pertinent positives and negatives included in the history, assessment and plan.  Included Systems: General, HEENT, Neck, Cardiovascular, Pulmonary, Gastrointestinal, Genitourinary, Musculoskeletal, Dermatologic, Endocrine, Hematological, Neurologic, Psychiatric Additional significant findings :  None   Exam: [name] assistant Filed Vitals:   05/30/13 1102  BP: 124/80  Height: 5\' 10"  (1.778 m)  Weight: 236 lb (107.049 kg)   General appearance:  Normal affect, orientation and appearance. Skin: Grossly normal HEENT: Without gross lesions.  No cervical or supraclavicular adenopathy. Thyroid normal.  Lungs:  Clear without wheezing, rales or rhonchi Cardiac: RR, harsh 5/6 systolic ejection murmur consistent with her VSD history Abdominal:  Soft, nontender, without masses, guarding, rebound, organomegaly or hernia Breasts:  Examined lying and sitting without masses, retractions, discharge or axillary adenopathy. Pelvic:  Ext/BUS/vagina normal  Cervix normal. Pap/HPV  Uterus anteverted, normal size, shape and contour, midline and mobile nontender   Adnexa  Without masses or tenderness    Anus and perineum  Normal   Rectovaginal  Normal sphincter tone without palpated masses or tenderness.    Assessment/Plan:  58 y.o. [id number] female for annual exam.   1. Postmenopausal. Patient without significant symptoms of hot flushes, night sweats, vaginal dryness or dyspareunia. No vaginal bleeding. Will continue to monitor. Patient is on tamoxifen and I emphasized the need to call if she does any vaginal bleeding. 2. History of breast cancer right breast. Mammography fall 2014. Exam  NED. Continue with annual mammography. SBE monthly reviewed. Currently on tamoxifen and actively being followed by her oncologist. 3. Pap smear 2012. Pap smear/HPV done today. No history of significant abnormal Pap smears. Repeat at 3-5 your intervals in this Pap smear is normal. 4. DEXA 2012 normal. Plan repeat DEXA age 58. Increased calcium vitamin D reviewed. 5. Health maintenance. Patient reports having routine blood work done through her other physician's offices. The blood work done today. Followup in one year, sooner as needed.   Note: This document was prepared with digital dictation and possible smart phrase technology. Any transcriptional errors that result from this process are unintentional.   Anastasio Auerbach MD, 11:28 AM 05/30/2013

## 2013-05-30 NOTE — Addendum Note (Signed)
Addended by: Nelva Nay on: 05/30/2013 11:39 AM   Modules accepted: Orders

## 2013-05-31 LAB — URINALYSIS W MICROSCOPIC + REFLEX CULTURE
BILIRUBIN URINE: NEGATIVE
CASTS: NONE SEEN
CRYSTALS: NONE SEEN
Glucose, UA: NEGATIVE mg/dL
Hgb urine dipstick: NEGATIVE
KETONES UR: NEGATIVE mg/dL
Leukocytes, UA: NEGATIVE
Nitrite: NEGATIVE
PH: 7.5 (ref 5.0–8.0)
Protein, ur: NEGATIVE mg/dL
SPECIFIC GRAVITY, URINE: 1.016 (ref 1.005–1.030)
Urobilinogen, UA: 0.2 mg/dL (ref 0.0–1.0)

## 2013-11-27 ENCOUNTER — Encounter: Payer: Self-pay | Admitting: Gynecology

## 2014-06-01 ENCOUNTER — Encounter: Payer: BC Managed Care – PPO | Admitting: Gynecology

## 2016-01-30 DIAGNOSIS — D1801 Hemangioma of skin and subcutaneous tissue: Secondary | ICD-10-CM | POA: Diagnosis not present

## 2016-01-30 DIAGNOSIS — L821 Other seborrheic keratosis: Secondary | ICD-10-CM | POA: Diagnosis not present

## 2016-01-30 DIAGNOSIS — L814 Other melanin hyperpigmentation: Secondary | ICD-10-CM | POA: Diagnosis not present

## 2016-04-02 DIAGNOSIS — G5622 Lesion of ulnar nerve, left upper limb: Secondary | ICD-10-CM | POA: Diagnosis not present

## 2016-04-13 DIAGNOSIS — D0501 Lobular carcinoma in situ of right breast: Secondary | ICD-10-CM | POA: Diagnosis not present

## 2016-04-14 DIAGNOSIS — D0501 Lobular carcinoma in situ of right breast: Secondary | ICD-10-CM | POA: Diagnosis not present

## 2016-04-14 DIAGNOSIS — Z853 Personal history of malignant neoplasm of breast: Secondary | ICD-10-CM | POA: Diagnosis not present

## 2016-04-22 DIAGNOSIS — M25522 Pain in left elbow: Secondary | ICD-10-CM | POA: Diagnosis not present

## 2016-04-22 DIAGNOSIS — R52 Pain, unspecified: Secondary | ICD-10-CM | POA: Diagnosis not present

## 2016-04-22 DIAGNOSIS — G5622 Lesion of ulnar nerve, left upper limb: Secondary | ICD-10-CM | POA: Diagnosis not present

## 2016-05-04 ENCOUNTER — Encounter: Payer: Self-pay | Admitting: Gynecology

## 2016-06-25 DIAGNOSIS — Z0000 Encounter for general adult medical examination without abnormal findings: Secondary | ICD-10-CM | POA: Diagnosis not present

## 2016-06-25 DIAGNOSIS — Z13220 Encounter for screening for lipoid disorders: Secondary | ICD-10-CM | POA: Diagnosis not present

## 2016-06-25 DIAGNOSIS — Z1159 Encounter for screening for other viral diseases: Secondary | ICD-10-CM | POA: Diagnosis not present

## 2016-06-29 DIAGNOSIS — R140 Abdominal distension (gaseous): Secondary | ICD-10-CM | POA: Diagnosis not present

## 2016-06-29 DIAGNOSIS — R194 Change in bowel habit: Secondary | ICD-10-CM | POA: Diagnosis not present

## 2016-07-02 DIAGNOSIS — D126 Benign neoplasm of colon, unspecified: Secondary | ICD-10-CM | POA: Diagnosis not present

## 2016-07-02 DIAGNOSIS — Z86010 Personal history of colonic polyps: Secondary | ICD-10-CM | POA: Diagnosis not present

## 2016-07-02 DIAGNOSIS — R194 Change in bowel habit: Secondary | ICD-10-CM | POA: Diagnosis not present

## 2016-07-07 ENCOUNTER — Ambulatory Visit (INDEPENDENT_AMBULATORY_CARE_PROVIDER_SITE_OTHER): Payer: 59 | Admitting: Gynecology

## 2016-07-07 ENCOUNTER — Encounter: Payer: Self-pay | Admitting: Gynecology

## 2016-07-07 VITALS — BP 130/80 | Ht 70.0 in | Wt 228.0 lb

## 2016-07-07 DIAGNOSIS — R5383 Other fatigue: Secondary | ICD-10-CM

## 2016-07-07 DIAGNOSIS — Z01419 Encounter for gynecological examination (general) (routine) without abnormal findings: Secondary | ICD-10-CM

## 2016-07-07 LAB — CBC WITH DIFFERENTIAL/PLATELET
BASOS PCT: 1 %
Basophils Absolute: 60 cells/uL (ref 0–200)
Eosinophils Absolute: 180 cells/uL (ref 15–500)
Eosinophils Relative: 3 %
HEMATOCRIT: 39.9 % (ref 35.0–45.0)
Hemoglobin: 12.9 g/dL (ref 11.7–15.5)
LYMPHS ABS: 1740 {cells}/uL (ref 850–3900)
LYMPHS PCT: 29 %
MCH: 29.1 pg (ref 27.0–33.0)
MCHC: 32.3 g/dL (ref 32.0–36.0)
MCV: 89.9 fL (ref 80.0–100.0)
MONO ABS: 420 {cells}/uL (ref 200–950)
MPV: 8.6 fL (ref 7.5–12.5)
Monocytes Relative: 7 %
NEUTROS ABS: 3600 {cells}/uL (ref 1500–7800)
Neutrophils Relative %: 60 %
Platelets: 230 10*3/uL (ref 140–400)
RBC: 4.44 MIL/uL (ref 3.80–5.10)
RDW: 14 % (ref 11.0–15.0)
WBC: 6 10*3/uL (ref 3.8–10.8)

## 2016-07-07 LAB — TSH: TSH: 1.92 mIU/L

## 2016-07-07 NOTE — Patient Instructions (Signed)
Followup in one year for annual exam, sooner if any issues 

## 2016-07-07 NOTE — Progress Notes (Signed)
    Rebecca Stephens 13-Mar-1955 [id number]        61 y.o.  [id number] for annual exam.  Patient does know some fatigue but contributes this to lack of exercise. No weight changes, skin or hair changes. No nausea vomiting diarrhea constipation.  Past medical history,surgical history, problem list, medications, allergies, family history and social history were all reviewed and documented as reviewed in the EPIC chart.  ROS:  Performed with pertinent positives and negatives included in the history, assessment and plan.   Additional significant findings :  None   Exam: Caryn Bee assistant Vitals:   07/07/16 1157  BP: 130/80  Weight: 228 lb (103.4 kg)  Height: 5\' 10"  (1.778 m)   Body mass index is 32.71 kg/m.  General appearance:  Normal affect, orientation and appearance. Skin: Grossly normal HEENT: Without gross lesions.  No cervical or supraclavicular adenopathy. Thyroid normal.  Lungs:  Clear without wheezing, rales or rhonchi Cardiac: RR, without RMG Abdominal:  Soft, nontender, without masses, guarding, rebound, organomegaly or hernia Breasts:  Examined lying and sitting without masses, retractions, discharge or axillary adenopathy. Pelvic:  Ext, BUS, Vagina: Normal with mild atrophic changes  Cervix: Normal  Uterus: Anteverted, normal size, shape and contour, midline and mobile nontender   Adnexa: Without masses or tenderness    Anus and perineum: Normal   Rectovaginal: Normal sphincter tone without palpated masses or tenderness.    Assessment/Plan:  61 y.o. [name] female for annual exam.   1. Postmenopausal/mild atrophic changes. No significant hot flushes, night sweats, vaginal dryness or any vaginal bleeding. Continue monitor report any issues or bleeding. 2. History of right breast cancer. Exam NED. Mammography 03/2016.  Continues on tamoxifen. Actively followed by her oncologist. 3. Fatigue. No localizing symptoms. I suspect this is secondary to lack of exercise.  Recent lab work showed normal comprehensive metabolic panel. Check baseline TSH CBC and vitamin D level. Will start on a regular exercise program. Will follow up with her primary physician if continues to be an issue after this. 4. Pap smear/HPV 2015. No Pap smear done today. No history of abnormal Pap smears. Plan repeat Pap smear approaching 5 year interval per current screening guidelines. 5. DEXA 2012 normal. Plan repeat DEXA next year at age 61. 82. Colonoscopy 2018. Repeat at their recommended interval. 7. Health maintenance. No routine lab work done as this is done elsewhere. Follow up 1 year, sooner as needed.   Anastasio Auerbach MD, 12:27 PM 07/07/2016

## 2016-07-08 ENCOUNTER — Other Ambulatory Visit: Payer: Self-pay | Admitting: Gynecology

## 2016-07-08 DIAGNOSIS — E559 Vitamin D deficiency, unspecified: Secondary | ICD-10-CM

## 2016-07-08 LAB — VITAMIN D 25 HYDROXY (VIT D DEFICIENCY, FRACTURES): VIT D 25 HYDROXY: 20 ng/mL — AB (ref 30–100)

## 2016-07-08 MED ORDER — VITAMIN D (ERGOCALCIFEROL) 1.25 MG (50000 UNIT) PO CAPS: 50000.0000 [IU] | ORAL_CAPSULE | ORAL | 0 refills | Status: DC

## 2016-10-12 ENCOUNTER — Other Ambulatory Visit: Payer: Self-pay | Admitting: Gynecology

## 2016-10-15 ENCOUNTER — Other Ambulatory Visit: Payer: 59

## 2016-11-03 DIAGNOSIS — N390 Urinary tract infection, site not specified: Secondary | ICD-10-CM | POA: Diagnosis not present

## 2017-02-24 DIAGNOSIS — J069 Acute upper respiratory infection, unspecified: Secondary | ICD-10-CM | POA: Diagnosis not present

## 2017-02-24 DIAGNOSIS — J45909 Unspecified asthma, uncomplicated: Secondary | ICD-10-CM | POA: Diagnosis not present

## 2017-02-28 DIAGNOSIS — J069 Acute upper respiratory infection, unspecified: Secondary | ICD-10-CM | POA: Diagnosis not present

## 2017-02-28 DIAGNOSIS — J45909 Unspecified asthma, uncomplicated: Secondary | ICD-10-CM | POA: Diagnosis not present

## 2017-04-19 DIAGNOSIS — D0501 Lobular carcinoma in situ of right breast: Secondary | ICD-10-CM | POA: Diagnosis not present

## 2017-04-19 DIAGNOSIS — N6459 Other signs and symptoms in breast: Secondary | ICD-10-CM | POA: Diagnosis not present

## 2017-04-19 DIAGNOSIS — Z803 Family history of malignant neoplasm of breast: Secondary | ICD-10-CM | POA: Diagnosis not present

## 2017-04-28 DIAGNOSIS — R1013 Epigastric pain: Secondary | ICD-10-CM | POA: Diagnosis not present

## 2017-04-28 DIAGNOSIS — J4520 Mild intermittent asthma, uncomplicated: Secondary | ICD-10-CM | POA: Diagnosis not present

## 2017-07-08 ENCOUNTER — Encounter: Payer: 59 | Admitting: Gynecology

## 2017-09-28 DIAGNOSIS — M19079 Primary osteoarthritis, unspecified ankle and foot: Secondary | ICD-10-CM | POA: Diagnosis not present

## 2017-09-28 DIAGNOSIS — M79672 Pain in left foot: Secondary | ICD-10-CM | POA: Diagnosis not present

## 2017-09-28 DIAGNOSIS — M79671 Pain in right foot: Secondary | ICD-10-CM | POA: Diagnosis not present

## 2017-09-30 DIAGNOSIS — R5383 Other fatigue: Secondary | ICD-10-CM | POA: Diagnosis not present

## 2017-09-30 DIAGNOSIS — R079 Chest pain, unspecified: Secondary | ICD-10-CM | POA: Diagnosis not present

## 2017-09-30 DIAGNOSIS — Z79899 Other long term (current) drug therapy: Secondary | ICD-10-CM | POA: Diagnosis not present

## 2017-10-15 ENCOUNTER — Telehealth: Payer: Self-pay

## 2017-10-15 NOTE — Telephone Encounter (Signed)
Contacted Dr. Eddie Dibbles office requesting records be faxed to our office.

## 2017-10-18 ENCOUNTER — Ambulatory Visit: Payer: 59 | Admitting: Cardiology

## 2017-10-18 ENCOUNTER — Encounter: Payer: Self-pay | Admitting: Cardiology

## 2017-10-18 VITALS — BP 134/74 | HR 68 | Ht 70.0 in | Wt 249.4 lb

## 2017-10-18 DIAGNOSIS — R079 Chest pain, unspecified: Secondary | ICD-10-CM | POA: Diagnosis not present

## 2017-10-18 DIAGNOSIS — Q210 Ventricular septal defect: Secondary | ICD-10-CM

## 2017-10-18 DIAGNOSIS — Z79899 Other long term (current) drug therapy: Secondary | ICD-10-CM

## 2017-10-18 DIAGNOSIS — Z7189 Other specified counseling: Secondary | ICD-10-CM

## 2017-10-18 DIAGNOSIS — Z01812 Encounter for preprocedural laboratory examination: Secondary | ICD-10-CM

## 2017-10-18 DIAGNOSIS — E6609 Other obesity due to excess calories: Secondary | ICD-10-CM | POA: Diagnosis not present

## 2017-10-18 DIAGNOSIS — Z6835 Body mass index (BMI) 35.0-35.9, adult: Secondary | ICD-10-CM

## 2017-10-18 DIAGNOSIS — R5383 Other fatigue: Secondary | ICD-10-CM

## 2017-10-18 MED ORDER — METOPROLOL TARTRATE 50 MG PO TABS: ORAL_TABLET | ORAL | 0 refills | Status: DC

## 2017-10-18 NOTE — Telephone Encounter (Signed)
Records received through [employer].

## 2017-10-18 NOTE — Patient Instructions (Signed)
Medication Instructions: Your physician recommends that you continue on your current medications as directed.    If you need a refill on your cardiac medications before your next appointment, please call your pharmacy.   Labwork: Your physician recommends that you return for lab work 1 week prior to Cardiac CTA (BMP, Lipid)   Procedures/Testing: Your physician has requested that you have cardiac CT. Cardiac computed tomography (CT) is a painless test that uses an x-ray machine to take clear, detailed pictures of your heart. For further information please visit [email or web address]. Please follow instruction sheet as given. You will receive a call to schedule this test  Your physician has requested that you have an echocardiogram. Echocardiography is a painless test that uses sound waves to create images of your heart. It provides your doctor with information about the size and shape of your heart and how well your heart's chambers and valves are working. This procedure takes approximately one hour. There are no restrictions for this procedure. [address]     Follow-Up: Your physician wants you to follow-up in 6 months with Dr. Harrell Gave. You will receive a reminder letter in the mail two months in advance. If you don't receive a letter, please call our office at [phone] to schedule this follow-up appointment.   Special Instructions:  Please arrive at the [facility] main entrance of [facility] at xx:xx AM (30-45 minutes prior to test start time)  [facility] [place], [place] [address] [phone]  Proceed to the [facility] Radiology Department (First Floor).  Please follow these instructions carefully (unless otherwise directed):   On the Night Before the Test: . Be sure to Drink plenty of water. . Do not consume any caffeinated/decaffeinated beverages or chocolate 12 hours prior to your test. . Do not take any  antihistamines 12 hours prior to your test.   On the Day of the Test: . Drink plenty of water. Do not drink any water within one hour of the test. . Do not eat any food 4 hours prior to the test. . You may take your regular medications prior to the test. . IF NOT ON BETA BLOCKER-Take 50 mg of Lopressor (metoprolol) two hour before test   After the Test: . Drink plenty of water. . After receiving IV contrast, you may experience a mild flushed feeling. This is normal. . On occasion, you may experience a mild rash up to 24 hours after the test. This is not dangerous. If this occurs, you can take Benadryl 25 mg and increase your fluid intake. . If you experience trouble breathing, this can be serious. If it is severe call 911 IMMEDIATELY. If it is mild, please call our office.      Thank you for choosing [facility] at [facility]!!

## 2017-10-18 NOTE — Progress Notes (Signed)
Cardiology Office Note:    Date:  10/18/2017   ID:  Rebecca Stephens, DOB 08-Jul-1955, MRN [id number]  PCP:  Hulan Fess, MD  Cardiologist:  Buford Dresser, MD PhD  Referring MD: Hulan Fess, MD   CC: chest tightness, fatigue  History of Present Illness:    Rebecca Stephens is a 62 y.o. female with a hx of asthma/bronchitis, prior breast cancer s/p tamoxifen, hypertension, membranous VSD who is seen as a new consult at the request of Little, Lennette Bihari, MD for the evaluation and management of chest pain and VSD.  Per notes received from her PCP, has had chest pain and fatigue for about 6 months. Feels fatigued all the time, needs to sleep more.   Patient's additional concerns: tingling in her feet, with an episode of significant swelling back in May 2019 where she almost could not walk on them. Saw a foot specialist, noted to have arthritis in her feet. Has not recurred.  Her symptoms: Lost weight in the past, about 45 lbs, was feeling well. Now weight has come back. Was borderline on a sleep study in the distant past. Since the weight has crept back on, she has noticed increasing fatigue over the last 6 mos or so. She also notes frequent chest tightness (see below). She has limited her activity due to concern that it might be her heart. She has a lot of anxiety about having artery blockages, recently had a friend who went through all testing and was found to have a stroke. Has also been under a lot of stress recently. She would like to lose weight and increase activity if her heart is up to it.  Chest pain: -Initial onset: over the last year -Quality: dull ache -Frequency: twice a day -Duration: about 20 minutes -Triggers: laying down in bed at night especially, but can be random. -Aggravating/alleviating factors: not related to exertion. Not associated with n/v/diaphoresis/SOB -Prior cardiac history: VSD as noted -Prior workup: had a stress test in 2013 here, no significant  abnormalities -Prior treatment: none -Alcohol: varies, few glasses/week of wine, occasional beer/mixed drink -Tobacco: never smoker -Comorbidities: has gained about 30 lbs due to diet. Has a history of GERD treated with PPI, recently escalated dose to see if it affects symptoms. Has improved but still has ache.  -Exercise level: sedentary -Cardiac ROS: no shortness of breath, no PND, no orthopnea, no syncope. Rare palpitations. -Family history: father had a heart murmur. Paternal grandmother with CVA. No known MI.  Past Medical History:  Diagnosis Date  . Asthma   . Bronchitis   . Cancer ([place])    Breast  . DCIS (ductal carcinoma in situ) of breast 2011   right breast  . Heart disease   . Hypertension   . VSD (ventricular septal defect)     Past Surgical History:  Procedure Laterality Date  . BREAST SURGERY     carcinoma removal  . CARDIAC CATHETERIZATION    . MOUTH SURGERY     bone removed from under tongue  . TOTAL HIP ARTHROPLASTY  2004   right hip    Current Medications: Current Outpatient Medications on File Prior to Visit  Medication Sig  . aspirin 81 MG tablet Take 81 mg by mouth daily.  Marland Kitchen escitalopram (LEXAPRO) 10 MG tablet Take 10 mg by mouth daily.  Marland Kitchen esomeprazole (NEXIUM) 20 MG capsule Take 20 mg by mouth daily at 12 noon.  . Melatonin 5 MG TABS Take 5 mg by mouth daily.   Marland Kitchen  TAMOXIFEN CITRATE PO Take by mouth.  . Vitamin D, Ergocalciferol, (DRISDOL) 50000 units CAPS capsule Take 1 capsule (50,000 Units total) by mouth every 7 (seven) days. (Patient not taking: Reported on 10/18/2017)   No current facility-administered medications on file prior to visit.     Now taking nexium BID  Allergies:   Patient has no known allergies.   Social History   Socioeconomic History  . Marital status: Married    Spouse name: Not on file  . Number of children: Not on file  . Years of education: Not on file  . Highest education level: Not on file  Occupational History  .  Not on file  Social Needs  . Financial resource strain: Not on file  . Food insecurity:    Worry: Not on file    Inability: Not on file  . Transportation needs:    Medical: Not on file    Non-medical: Not on file  Tobacco Use  . Smoking status: Never Smoker  . Smokeless tobacco: Never Used  Substance and Sexual Activity  . Alcohol use: Yes    Alcohol/week: 2.0 standard drinks    Types: 2 Glasses of wine per week  . Drug use: No  . Sexual activity: Yes    Birth control/protection: Post-menopausal    Comment: 1st intercourse 62 yo-Fewer than 5 partners  Lifestyle  . Physical activity:    Days per week: Not on file    Minutes per session: Not on file  . Stress: Not on file  Relationships  . Social connections:    Talks on phone: Not on file    Gets together: Not on file    Attends religious service: Not on file    Active member of club or organization: Not on file    Attends meetings of clubs or organizations: Not on file    Relationship status: Not on file  Other Topics Concern  . Not on file  Social History Narrative  . Not on file     Family History: The patient's family history includes Breast cancer (age of onset: 53) in her maternal grandmother; Breast cancer (age of onset: 8) in her mother; Cancer in her maternal grandfather and paternal grandfather; Clotting disorder in her father; Stroke in her paternal grandfather and paternal grandmother.  ROS:   Please see the history of present illness.  Additional pertinent ROS: Review of Systems  Constitutional: Positive for malaise/fatigue. Negative for chills and fever.  HENT: Negative for ear pain and hearing loss.   Eyes: Negative for blurred vision and pain.  Respiratory: Negative for sputum production and shortness of breath.   Cardiovascular: Positive for chest pain and leg swelling. Negative for palpitations, orthopnea, claudication and PND.  Gastrointestinal: Positive for heartburn. Negative for abdominal pain,  blood in stool and melena.  Genitourinary: Negative for dysuria and hematuria.  Musculoskeletal: Positive for joint pain. Negative for falls and myalgias.  Skin: Negative for rash.  Neurological: Positive for tingling. Negative for focal weakness and loss of consciousness.  Endo/Heme/Allergies: Does not bruise/bleed easily.   EKGs/Labs/Other Studies Reviewed:    The following studies were reviewed today: MPI 01-16-12 Rest ECG: NSR - Normal EKG  Stress ECG: No significant change from baseline ECG  QPS Raw Data Images:  Normal; no motion artifact; normal heart/lung ratio. Stress Images:  There is decreased uptake in the anterior wall. Rest Images:  There is decreased uptake in the anterior wall. Subtraction (SDS):  There is a fixed anteriour defect  that is most consistent with breast attenuation.  Impression Exercise Capacity:  Lexiscan with no exercise. BP Response:  Normal blood pressure response. Clinical Symptoms:  No significant symptoms noted. ECG Impression:  No significant ST segment change suggestive of ischemia. Comparison with Prior Nuclear Study: No significant change from previous study  Overall Impression:  Low risk stress nuclear study. and When compared to prior study performed there has been no significant There is moderate breast attenuation artifact..  LV Wall Motion:  NL LV Function; NL Wall Motion  Echo 12/16/11 Study Conclusions  - Left ventricle: The cavity size was normal. Systolic function was normal. The estimated ejection fraction was in the range of 60% to 65%. Wall motion was normal; there were no regional wall motion abnormalities. LV apical false tendon. There is a small membranous VSD with left to right flow by color doppler. There is no right to left shunting noted by saline microbubble contrast. Doppler was not performed across the VSD. Doppler parameters are consistent with abnormal left ventricular relaxation (grade 1  diastolic dysfunction). The E/e' ratio is <10, suggesting normal LV filling pressure. - Atrial septum: No defect or patent foramen ovale was identified by color doppler or saline microbubble contrast study. - Tricuspid valve: Mild regurgitation. - Pulmonary arteries: PA peak pressure: 50mm Hg (S). - Inferior vena cava: The vessel was normal in size; the respirophasic diameter changes were in the normal range (= 50%); findings are consistent with normal central venous pressure.  EKG:  EKG is ordered today.  The ekg ordered today demonstrates normal sinus rhythm, possible old septal infarct  Recent Labs: No results found for requested labs within last 8760 hours.  Recent Lipid Panel No results found for: CHOL, TRIG, HDL, CHOLHDL, VLDL, LDLCALC, LDLDIRECT  Physical Exam:    VS:  BP 134/74 (BP Location: Right Arm, Patient Position: Sitting, Cuff Size: Normal)   Pulse 68   Ht 5\' 10"  (1.778 m)   Wt 249 lb 6.4 oz (113.1 kg)   BMI 35.79 kg/m     Wt Readings from Last 3 Encounters:  10/18/17 249 lb 6.4 oz (113.1 kg)  07/07/16 228 lb (103.4 kg)  05/30/13 236 lb (107 kg)     GEN: Well nourished, well developed in no acute distress HEENT: Normal NECK: No JVD; No carotid bruits LYMPHATICS: No lymphadenopathy CARDIAC: regular rhythm, normal S1 and S2, no rubs, gallops. 4/6 continuous machinelike murmur heard across the precordium. Radial and DP pulses 2+ bilaterally. RESPIRATORY:  Clear to auscultation without rales, wheezing or rhonchi  ABDOMEN: Soft, non-tender, non-distended MUSCULOSKELETAL:  No edema; No deformity  SKIN: Warm and dry NEUROLOGIC:  Alert and oriented x 3 PSYCHIATRIC:  Normal affect   ASSESSMENT:    1. Other fatigue   2. Chest pain, unspecified type   3. Class 2 obesity due to excess calories with body mass index (BMI) of 35.0 to 35.9 in adult, unspecified whether serious comorbidity present   4. Counseling on health promotion and disease prevention    5. VSD (ventricular septal defect)   6. Pre-procedure lab exam   7. Medication management    PLAN:    1. Fatigue -thyroid test done through PCP per received notes, though I do not have results -snores but doesn't think she stops breathing. However falls asleep during the day, doesn't feel rested when she sleeps. Wants to lose weight before redoing the sleep study. This may be a component of sleep apnea -continued workup per PCP  2. Chest pain:  atypical timing, but dullness is concerning. Her prior lipids were above goal, and her weight gain is a risk. -CT coronary with BMET prior, ok to give metoprolol before the scan  3. VSD: last echo 2013. Has loud murmur today. No LE edema today, no JVD, but had a prior episode of significant LE swelling. -echo to monitor chamber dimensions, flow, filling pressures as a cause of her symptoms  4. Prevention: -lipids--last 2018, recheck with BMET to further risk stratify and update ASCVD risk. Discuss again at next visit Prevention: -recommend heart healthy/Mediterranean diet, with whole grains, fruits, vegetable, fish, lean meats, nuts, and olive oil. Limit salt. -recommend moderate walking, 3-5 times/week for 30-50 minutes each session. Aim for at least 150 minutes.week. Goal should be pace of 3 miles/hours, or walking 1.5 miles in 30 minutes -recommend avoidance of tobacco products. Avoid excess alcohol. -Additional risk factor control:  -Diabetes: A1c is 5.4 05/2016  -Lipids: Tchol 220, HDL 83, LDL 118, TG 93 in 5/018  -Blood pressure control: monitor, will improve with weight loss  -Weight: Class 2 obesity per BMI. We discussed, once clear from a cardiac perspective she is very motivated to lose weight.  Plan for follow up:6 mos  Medication Adjustments/Labs and Tests Ordered: Current medicines are reviewed at length with the patient today.  Concerns regarding medicines are outlined above.  Orders Placed This Encounter  Procedures  . CT  CORONARY MORPH W/CTA COR W/SCORE W/CA W/CM &/OR WO/CM  . CT CORONARY FRACTIONAL FLOW RESERVE DATA PREP  . CT CORONARY FRACTIONAL FLOW RESERVE FLUID ANALYSIS  . Basic metabolic panel  . Lipid panel  . EKG 12-Lead  . ECHOCARDIOGRAM COMPLETE   Meds ordered this encounter  Medications  . metoprolol tartrate (LOPRESSOR) 50 MG tablet    Sig: TAKE 1 TABLET 2 HR PRIOR TO CARDIAC PROCEDURE    Dispense:  1 tablet    Refill:  0    Patient Instructions  Medication Instructions: Your physician recommends that you continue on your current medications as directed.    If you need a refill on your cardiac medications before your next appointment, please call your pharmacy.   Labwork: Your physician recommends that you return for lab work 1 week prior to Cardiac CTA (BMP, Lipid)   Procedures/Testing: Your physician has requested that you have cardiac CT. Cardiac computed tomography (CT) is a painless test that uses an x-ray machine to take clear, detailed pictures of your heart. For further information please visit [email or web address]. Please follow instruction sheet as given. You will receive a call to schedule this test  Your physician has requested that you have an echocardiogram. Echocardiography is a painless test that uses sound waves to create images of your heart. It provides your doctor with information about the size and shape of your heart and how well your heart's chambers and valves are working. This procedure takes approximately one hour. There are no restrictions for this procedure. [facility] [address]     Follow-Up: Your physician wants you to follow-up in 6 months with Dr. Harrell Gave. You will receive a reminder letter in the mail two months in advance. If you don't receive a letter, please call our office at [phone] to schedule this follow-up appointment.   Special Instructions:  Please arrive at the [facility] main entrance of [facility] at  xx:xx AM (30-45 minutes prior to test start time)  [facility] [place], [place] [address] [phone]  Proceed  to the [facility] Radiology Department (First Floor).  Please follow these instructions carefully (unless otherwise directed):   On the Night Before the Test: . Be sure to Drink plenty of water. . Do not consume any caffeinated/decaffeinated beverages or chocolate 12 hours prior to your test. . Do not take any antihistamines 12 hours prior to your test.   On the Day of the Test: . Drink plenty of water. Do not drink any water within one hour of the test. . Do not eat any food 4 hours prior to the test. . You may take your regular medications prior to the test. . IF NOT ON BETA BLOCKER-Take 50 mg of Lopressor (metoprolol) two hour before test   After the Test: . Drink plenty of water. . After receiving IV contrast, you may experience a mild flushed feeling. This is normal. . On occasion, you may experience a mild rash up to 24 hours after the test. This is not dangerous. If this occurs, you can take Benadryl 25 mg and increase your fluid intake. . If you experience trouble breathing, this can be serious. If it is severe call 911 IMMEDIATELY. If it is mild, please call our office.      Thank you for choosing [facility]!!       Signed, Buford Dresser, MD PhD 10/18/2017 1:02 PM    [place]

## 2017-10-27 ENCOUNTER — Ambulatory Visit (HOSPITAL_COMMUNITY): Payer: 59 | Attending: Cardiology

## 2017-10-27 ENCOUNTER — Other Ambulatory Visit: Payer: Self-pay

## 2017-10-27 DIAGNOSIS — I517 Cardiomegaly: Secondary | ICD-10-CM | POA: Insufficient documentation

## 2017-10-27 DIAGNOSIS — Q210 Ventricular septal defect: Secondary | ICD-10-CM | POA: Insufficient documentation

## 2017-10-27 DIAGNOSIS — R079 Chest pain, unspecified: Secondary | ICD-10-CM | POA: Insufficient documentation

## 2017-11-08 ENCOUNTER — Telehealth: Payer: Self-pay

## 2017-11-08 NOTE — Telephone Encounter (Signed)
Opened in error

## 2017-11-19 ENCOUNTER — Telehealth: Payer: Self-pay | Admitting: Cardiology

## 2017-11-19 NOTE — Telephone Encounter (Signed)
Spoke with Pt and advised that a message has been sent to Dr. Harrell Gave and will contact her with MD's recommendation. Pt verbalized understanding.

## 2017-11-19 NOTE — Telephone Encounter (Signed)
New Message    Patient wants to know if she has the heart cath, does she still need to have cardiac ct?  Patient doesn't want to have to do both, if she does the cardiac ct will she still need to have heart cath if you find something , can you just do the heart cath and not do the cardiac ct?

## 2017-11-19 NOTE — Telephone Encounter (Signed)
Spoke with pt who was agreeable to have procedure done. Advised that she is scheduled for 11/25/17 at 530 am with Dr. Missy Sabins. Pt will also be seen in office on 11/23/17 at 1140 am by Dr. Harrell Gave to go over procedure and get pre-lab.

## 2017-11-19 NOTE — Telephone Encounter (Signed)
I spoke with Dr. Haroldine Laws, and he can do the right heart cath with shunt fraction evaluation and coronary angiography either 10/31 or 11/1 in the cath lab. This would answer both the question about the VSD fraction as well as whether a blockage is causing her symptoms. If she is amenable to this, Dr. Haroldine Laws recommends calling the cath lab to schedule directly or you can call his nurse Heather at [phone]  Thank you.

## 2017-11-23 ENCOUNTER — Ambulatory Visit: Payer: 59 | Admitting: Cardiology

## 2017-11-23 ENCOUNTER — Encounter: Payer: Self-pay | Admitting: Cardiology

## 2017-11-23 VITALS — BP 126/70 | HR 65 | Ht 70.0 in | Wt 247.4 lb

## 2017-11-23 DIAGNOSIS — Z01812 Encounter for preprocedural laboratory examination: Secondary | ICD-10-CM | POA: Diagnosis not present

## 2017-11-23 DIAGNOSIS — R079 Chest pain, unspecified: Secondary | ICD-10-CM

## 2017-11-23 DIAGNOSIS — Z79899 Other long term (current) drug therapy: Secondary | ICD-10-CM

## 2017-11-23 DIAGNOSIS — Q210 Ventricular septal defect: Secondary | ICD-10-CM | POA: Diagnosis not present

## 2017-11-23 NOTE — Progress Notes (Signed)
Cardiology Office Note:    Date:  11/23/2017   ID:  KENNETHA PEARMAN, DOB 1955/05/30, MRN [id number]  PCP:  Hulan Fess, MD  Cardiologist:  Buford Dresser, MD PhD  Referring MD: Hulan Fess, MD   CC: chest tightness, fatigue  History of Present Illness:    Rebecca Stephens is a 62 y.o. female with a hx of asthma/bronchitis, prior breast cancer s/p tamoxifen, hypertension, membranous VSD who is seen in follow up for the evaluation and management of chest pain and VSD. She was seen for initial consult on 10/18/17.   At initial visit, noted to have chest pain and fatigue for about 6 months. Chest pain is dull, occurs about twice per day for 20 mins each time. Seems to occur at night/lying down but can be random. Not related to exertion, no associated nausea/vomiting/diaphoresis/shortness of breath. Stress test in 2013 here, no significant abnormalities  Risk factors: -Alcohol: varies, few glasses/week of wine, occasional beer/mixed drink -Tobacco: never smoker -Comorbidities: has gained about 30 lbs due to diet. Has a history of GERD treated with PPI, recently escalated dose to see if it affects symptoms. Has improved but still has ache.  -Exercise level: sedentary -Family history: father had a heart murmur. Paternal grandmother with CVA. No known MI.  We ordered an echo at the last visit, which noted VSD and mild chamber enlargement. Unable to estimate pressures or shunt fraction. Given this, we discussed cath over the phone, and she is amenable. Here today to discuss cath and review her symptoms.   Symptoms are unchanged. Reviewed risks/benefits of cath and sedation medications, she is agreeable.   Past Medical History:  Diagnosis Date  . Asthma   . Bronchitis   . Cancer ([place])    Breast  . DCIS (ductal carcinoma in situ) of breast 2011   right breast  . Heart disease   . Hypertension   . VSD (ventricular septal defect)     Past Surgical History:  Procedure  Laterality Date  . BREAST SURGERY     carcinoma removal  . CARDIAC CATHETERIZATION    . MOUTH SURGERY     bone removed from under tongue  . TOTAL HIP ARTHROPLASTY  2004   right hip    Current Medications: Current Outpatient Medications on File Prior to Visit  Medication Sig  . aspirin EC 81 MG tablet Take 81 mg by mouth daily.  Marland Kitchen escitalopram (LEXAPRO) 10 MG tablet Take 10 mg by mouth at bedtime.   Marland Kitchen esomeprazole (NEXIUM) 20 MG capsule Take 20 mg by mouth 2 (two) times daily.   . Melatonin 10 MG TABS Take 20 mg by mouth at bedtime as needed (sleep).  . metoprolol tartrate (LOPRESSOR) 50 MG tablet TAKE 1 TABLET 2 HR PRIOR TO CARDIAC PROCEDURE   No current facility-administered medications on file prior to visit.     Now taking nexium BID  Allergies:   Patient has no known allergies.   Social History   Socioeconomic History  . Marital status: Married    Spouse name: Not on file  . Number of children: Not on file  . Years of education: Not on file  . Highest education level: Not on file  Occupational History  . Not on file  Social Needs  . Financial resource strain: Not on file  . Food insecurity:    Worry: Not on file    Inability: Not on file  . Transportation needs:    Medical: Not on file  Non-medical: Not on file  Tobacco Use  . Smoking status: Never Smoker  . Smokeless tobacco: Never Used  Substance and Sexual Activity  . Alcohol use: Yes    Alcohol/week: 2.0 standard drinks    Types: 2 Glasses of wine per week  . Drug use: No  . Sexual activity: Yes    Birth control/protection: Post-menopausal    Comment: 1st intercourse 62 yo-Fewer than 5 partners  Lifestyle  . Physical activity:    Days per week: Not on file    Minutes per session: Not on file  . Stress: Not on file  Relationships  . Social connections:    Talks on phone: Not on file    Gets together: Not on file    Attends religious service: Not on file    Active member of club or organization:  Not on file    Attends meetings of clubs or organizations: Not on file    Relationship status: Not on file  Other Topics Concern  . Not on file  Social History Narrative  . Not on file     Family History: The patient's family history includes Breast cancer (age of onset: 25) in her maternal grandmother; Breast cancer (age of onset: 44) in her mother; Cancer in her maternal grandfather and paternal grandfather; Clotting disorder in her father; Stroke in her paternal grandfather and paternal grandmother.  ROS:   Please see the history of present illness.  Additional pertinent ROS: Review of Systems  Constitutional: Positive for malaise/fatigue. Negative for chills and fever.  HENT: Negative for ear pain and hearing loss.   Eyes: Negative for blurred vision and pain.  Respiratory: Negative for sputum production and shortness of breath.   Cardiovascular: Positive for chest pain and leg swelling. Negative for palpitations, orthopnea, claudication and PND.  Gastrointestinal: Positive for heartburn. Negative for abdominal pain, blood in stool and melena.  Genitourinary: Negative for dysuria and hematuria.  Musculoskeletal: Positive for joint pain. Negative for falls and myalgias.  Skin: Negative for rash.  Neurological: Positive for tingling. Negative for focal weakness and loss of consciousness.  Endo/Heme/Allergies: Does not bruise/bleed easily.   EKGs/Labs/Other Studies Reviewed:    The following studies were reviewed today: Echo 10/27/17 Study Conclusions - Left ventricle: The cavity size was mildly dilated. Wall   thickness was normal. Systolic function was normal. The estimated   ejection fraction was in the range of 55% to 60%. Wall motion was   normal; there were no regional wall motion abnormalities. Left   ventricular diastolic function parameters were normal. - Left atrium: The atrium was mildly dilated.  Impressions: - Normal LV function; small perimembranous VSD with  peak velocity   5.4 m/s; mild LVE; mild LAE.  MPI 12/30/11 Rest ECG: NSR - Normal EKG  Stress ECG: No significant change from baseline ECG  QPS Raw Data Images:  Normal; no motion artifact; normal heart/lung ratio. Stress Images:  There is decreased uptake in the anterior wall. Rest Images:  There is decreased uptake in the anterior wall. Subtraction (SDS):  There is a fixed anteriour defect that is most consistent with breast attenuation.  Impression Exercise Capacity:  Lexiscan with no exercise. BP Response:  Normal blood pressure response. Clinical Symptoms:  No significant symptoms noted. ECG Impression:  No significant ST segment change suggestive of ischemia. Comparison with Prior Nuclear Study: No significant change from previous study  Overall Impression:  Low risk stress nuclear study. and When compared to prior study performed there has  been no significant There is moderate breast attenuation artifact..  LV Wall Motion:  NL LV Function; NL Wall Motion  Echo 12/16/11 Study Conclusions  - Left ventricle: The cavity size was normal. Systolic function was normal. The estimated ejection fraction was in the range of 60% to 65%. Wall motion was normal; there were no regional wall motion abnormalities. LV apical false tendon. There is a small membranous VSD with left to right flow by color doppler. There is no right to left shunting noted by saline microbubble contrast. Doppler was not performed across the VSD. Doppler parameters are consistent with abnormal left ventricular relaxation (grade 1 diastolic dysfunction). The E/e' ratio is <10, suggesting normal LV filling pressure. - Atrial septum: No defect or patent foramen ovale was identified by color doppler or saline microbubble contrast study. - Tricuspid valve: Mild regurgitation. - Pulmonary arteries: PA peak pressure: 40mm Hg (S). - Inferior vena cava: The vessel was normal in size;  the respirophasic diameter changes were in the normal range (= 50%); findings are consistent with normal central venous pressure.  EKG:  EKG is ordered today.  The ekg ordered today demonstrates normal sinus rhythm, possible old septal infarct  Recent Labs: No results found for requested labs within last 8760 hours.  Recent Lipid Panel No results found for: CHOL, TRIG, HDL, CHOLHDL, VLDL, LDLCALC, LDLDIRECT  Physical Exam:    VS:  BP 126/70   Pulse 65   Ht 5\' 10"  (1.778 m)   Wt 247 lb 6.4 oz (112.2 kg)   BMI 35.50 kg/m     Wt Readings from Last 3 Encounters:  11/23/17 247 lb 6.4 oz (112.2 kg)  10/18/17 249 lb 6.4 oz (113.1 kg)  07/07/16 228 lb (103.4 kg)     GEN: Well nourished, well developed in no acute distress HEENT: Normal NECK: No JVD; No carotid bruits LYMPHATICS: No lymphadenopathy CARDIAC: regular rhythm, normal S1 and S2, no rubs, gallops. 4/6 continuous machinelike murmur heard across the precordium. Radial and DP pulses 2+ bilaterally. RESPIRATORY:  Clear to auscultation without rales, wheezing or rhonchi  ABDOMEN: Soft, non-tender, non-distended MUSCULOSKELETAL:  No edema; No deformity  SKIN: Warm and dry NEUROLOGIC:  Alert and oriented x 3 PSYCHIATRIC:  Normal affect   ASSESSMENT:    1. Pre-procedure lab exam   2. Medication management   3. VSD (ventricular septal defect)   4. Chest pain, unspecified type    PLAN:    1. Chest pain: dullness is concerning, has risk factors, ECG concerning for possible prior infarct. With fatigue and chest pain, warrants definitive investigation -had planned for CT coronary, but given plans to do cath for VSD, will do diagnostic coronary study for definitive evaluation Risks and benefits of cardiac catheterization have been discussed with the patient.  These include bleeding, infection, kidney damage, stroke, heart attack, death.  The patient understands these risks and is willing to proceed. -pre cath BMP and CBC  ordered  3. VSD: last echo 2013. Has loud murmur today. No LE edema today, no JVD, but had a prior episode of significant LE swelling. -echo showed VSD and mild chamber dilation. Unable to estimate pressures or shunt fraction on echo -plan RHC with shunt evaluation with Dr. Haroldine Laws on 10/31.  4. Prevention: -lipids--last 2018, recheck with BMET to further risk stratify and update ASCVD risk.  Prevention: -recommend heart healthy/Mediterranean diet, with whole grains, fruits, vegetable, fish, lean meats, nuts, and olive oil. Limit salt. -recommend moderate walking, 3-5 times/week for 30-50 minutes  each session. Aim for at least 150 minutes.week. Goal should be pace of 3 miles/hours, or walking 1.5 miles in 30 minutes -recommend avoidance of tobacco products. Avoid excess alcohol. -Additional risk factor control:  -Diabetes: A1c is 5.4 05/2016  -Lipids: Tchol 220, HDL 83, LDL 118, TG 93 in 5/018. Recheck today.  -Blood pressure control: monitor, will improve with weight loss  -Weight: Class 2 obesity per BMI. We discussed, once clear from a cardiac perspective she is very motivated to lose weight.  Plan for follow up: 12 mos  Medication Adjustments/Labs and Tests Ordered: Current medicines are reviewed at length with the patient today.  Concerns regarding medicines are outlined above.  Orders Placed This Encounter  Procedures  . CBC  . Basic metabolic panel  . Lipid panel  . EKG 12-Lead   No orders of the defined types were placed in this encounter.   Patient Instructions  Medication Instructions:  Your Physician recommend you continue on your current medication as directed.    If you need a refill on your cardiac medications before your next appointment, please call your pharmacy.   Lab work: Your physician recommends that you return for lab work today (CBC, BMP, Lipid)  If you have labs (blood work) drawn today and your tests are completely normal, you will receive your  results only by: Marland Kitchen MyChart Message (if you have MyChart) OR . A paper copy in the mail If you have any lab test that is abnormal or we need to change your treatment, we will call you to review the results.  Testing/Procedures: Your physician has requested that you have a cardiac catheterization. Cardiac catheterization is used to diagnose and/or treat various heart conditions. Doctors may recommend this procedure for a number of different reasons. The most common reason is to evaluate chest pain. Chest pain can be a symptom of coronary artery disease (CAD), and cardiac catheterization can show whether plaque is narrowing or blocking your heart's arteries. This procedure is also used to evaluate the valves, as well as measure the blood flow and oxygen levels in different parts of your heart. For further information please visit [email or web address]. Please follow instruction sheet, as given. [facility]    Follow-Up: At [facility], you and your health needs are our priority.  As part of our continuing mission to provide you with exceptional heart care, we have created designated Provider Care Teams.  These Care Teams include your primary Cardiologist (physician) and Advanced Practice Providers (APPs -  Physician Assistants and Nurse Practitioners) who all work together to provide you with the care you need, when you need it. You will need a follow up appointment in 1 years.  Please call our office 2 months in advance to schedule this appointment.  You may see Buford Dresser, MD or one of the following Advanced Practice Providers on your designated Care Team:   Rosaria Ferries, PA-C . Jory Sims, DNP, ANP  Any Other Special Instructions Will Be Listed Below (If Applicable).     [address] [place] [place] [address] Dept: [phone] Loc: [place]  11/23/2017  You are scheduled for a Cardiac Catheterization on Thursday, October 31 with Dr. Glori Bickers.  1. Please arrive at the [facility] (Main Entrance A) at [facility]: [address] [place], [place] [address] at 5:30 AM (This time is two hours before your procedure to ensure your  preparation). Free valet parking service is available.   Special note: Every effort is made to have your procedure done on time. Please understand that emergencies sometimes delay scheduled procedures.  2. Diet: Do not eat solid foods after midnight.  The patient may have clear liquids until 5am upon the day of the procedure.  3. Labs: You will need to have blood drawn on Today CBC, BMP  4. Medication instructions in preparation for your procedure:   Contrast Allergy: No   On the morning of your procedure, take your Aspirin and any morning medicines NOT listed above.  You may use sips of water.  5. Plan for one night stay--bring personal belongings. 6. Bring a current list of your medications and current insurance cards. 7. You MUST have a responsible person to drive you home. 8. Someone MUST be with you the first 24 hours after you arrive home or your discharge will be delayed. 9. Please wear clothes that are easy to get on and off and wear slip-on shoes.  Thank you for allowing [place] to care for you!   -- [facility]       Signed, Buford Dresser, MD PhD 11/23/2017 12:14 PM    [place]

## 2017-11-23 NOTE — H&P (View-Only) (Signed)
Cardiology Office Note:    Date:  11/23/2017   ID:  Rebecca Stephens, DOB [birth year], MRN [id number]  PCP:  Hulan Fess, MD  Cardiologist:  Buford Dresser, MD PhD  Referring MD: Hulan Fess, MD   CC: chest tightness, fatigue  History of Present Illness:    Rebecca Stephens is a 62 y.o. female with a hx of asthma/bronchitis, prior breast cancer s/p tamoxifen, hypertension, membranous VSD who is seen in follow up for the evaluation and management of chest pain and VSD. She was seen for initial consult on 10/18/17.   At initial visit, noted to have chest pain and fatigue for about 6 months. Chest pain is dull, occurs about twice per day for 20 mins each time. Seems to occur at night/lying down but can be random. Not related to exertion, no associated nausea/vomiting/diaphoresis/shortness of breath. Stress test in 2013 here, no significant abnormalities  Risk factors: -Alcohol: varies, few glasses/week of wine, occasional beer/mixed drink -Tobacco: never smoker -Comorbidities: has gained about 30 lbs due to diet. Has a history of GERD treated with PPI, recently escalated dose to see if it affects symptoms. Has improved but still has ache.  -Exercise level: sedentary -Family history: father had a heart murmur. Paternal grandmother with CVA. No known MI.  We ordered an echo at the last visit, which noted VSD and mild chamber enlargement. Unable to estimate pressures or shunt fraction. Given this, we discussed cath over the phone, and she is amenable. Here today to discuss cath and review her symptoms.   Symptoms are unchanged. Reviewed risks/benefits of cath and sedation medications, she is agreeable.   Past Medical History:  Diagnosis Date  . Asthma   . Bronchitis   . Cancer ([place])    Breast  . DCIS (ductal carcinoma in situ) of breast 2011   right breast  . Heart disease   . Hypertension   . VSD (ventricular septal defect)     Past Surgical History:  Procedure  Laterality Date  . BREAST SURGERY     carcinoma removal  . CARDIAC CATHETERIZATION    . MOUTH SURGERY     bone removed from under tongue  . TOTAL HIP ARTHROPLASTY  2004   right hip    Current Medications: Current Outpatient Medications on File Prior to Visit  Medication Sig  . aspirin EC 81 MG tablet Take 81 mg by mouth daily.  Marland Kitchen escitalopram (LEXAPRO) 10 MG tablet Take 10 mg by mouth at bedtime.   Marland Kitchen esomeprazole (NEXIUM) 20 MG capsule Take 20 mg by mouth 2 (two) times daily.   . Melatonin 10 MG TABS Take 20 mg by mouth at bedtime as needed (sleep).  . metoprolol tartrate (LOPRESSOR) 50 MG tablet TAKE 1 TABLET 2 HR PRIOR TO CARDIAC PROCEDURE   No current facility-administered medications on file prior to visit.     Now taking nexium BID  Allergies:   Patient has no known allergies.   Social History   Socioeconomic History  . Marital status: Married    Spouse name: Not on file  . Number of children: Not on file  . Years of education: Not on file  . Highest education level: Not on file  Occupational History  . Not on file  Social Needs  . Financial resource strain: Not on file  . Food insecurity:    Worry: Not on file    Inability: Not on file  . Transportation needs:    Medical: Not on file  Non-medical: Not on file  Tobacco Use  . Smoking status: Never Smoker  . Smokeless tobacco: Never Used  Substance and Sexual Activity  . Alcohol use: Yes    Alcohol/week: 2.0 standard drinks    Types: 2 Glasses of wine per week  . Drug use: No  . Sexual activity: Yes    Birth control/protection: Post-menopausal    Comment: 1st intercourse 62 yo-Fewer than 5 partners  Lifestyle  . Physical activity:    Days per week: Not on file    Minutes per session: Not on file  . Stress: Not on file  Relationships  . Social connections:    Talks on phone: Not on file    Gets together: Not on file    Attends religious service: Not on file    Active member of club or organization:  Not on file    Attends meetings of clubs or organizations: Not on file    Relationship status: Not on file  Other Topics Concern  . Not on file  Social History Narrative  . Not on file     Family History: The patient's family history includes Breast cancer (age of onset: 71) in her maternal grandmother; Breast cancer (age of onset: 58) in her mother; Cancer in her maternal grandfather and paternal grandfather; Clotting disorder in her father; Stroke in her paternal grandfather and paternal grandmother.  ROS:   Please see the history of present illness.  Additional pertinent ROS: Review of Systems  Constitutional: Positive for malaise/fatigue. Negative for chills and fever.  HENT: Negative for ear pain and hearing loss.   Eyes: Negative for blurred vision and pain.  Respiratory: Negative for sputum production and shortness of breath.   Cardiovascular: Positive for chest pain and leg swelling. Negative for palpitations, orthopnea, claudication and PND.  Gastrointestinal: Positive for heartburn. Negative for abdominal pain, blood in stool and melena.  Genitourinary: Negative for dysuria and hematuria.  Musculoskeletal: Positive for joint pain. Negative for falls and myalgias.  Skin: Negative for rash.  Neurological: Positive for tingling. Negative for focal weakness and loss of consciousness.  Endo/Heme/Allergies: Does not bruise/bleed easily.   EKGs/Labs/Other Studies Reviewed:    The following studies were reviewed today: Echo 10/27/17 Study Conclusions - Left ventricle: The cavity size was mildly dilated. Wall   thickness was normal. Systolic function was normal. The estimated   ejection fraction was in the range of 55% to 60%. Wall motion was   normal; there were no regional wall motion abnormalities. Left   ventricular diastolic function parameters were normal. - Left atrium: The atrium was mildly dilated.  Impressions: - Normal LV function; small perimembranous VSD with  peak velocity   5.4 m/s; mild LVE; mild LAE.  MPI 12/30/11 Rest ECG: NSR - Normal EKG  Stress ECG: No significant change from baseline ECG  QPS Raw Data Images:  Normal; no motion artifact; normal heart/lung ratio. Stress Images:  There is decreased uptake in the anterior wall. Rest Images:  There is decreased uptake in the anterior wall. Subtraction (SDS):  There is a fixed anteriour defect that is most consistent with breast attenuation.  Impression Exercise Capacity:  Lexiscan with no exercise. BP Response:  Normal blood pressure response. Clinical Symptoms:  No significant symptoms noted. ECG Impression:  No significant ST segment change suggestive of ischemia. Comparison with Prior Nuclear Study: No significant change from previous study  Overall Impression:  Low risk stress nuclear study. and When compared to prior study performed there has  been no significant There is moderate breast attenuation artifact..  LV Wall Motion:  NL LV Function; NL Wall Motion  Echo 12/16/11 Study Conclusions  - Left ventricle: The cavity size was normal. Systolic function was normal. The estimated ejection fraction was in the range of 60% to 65%. Wall motion was normal; there were no regional wall motion abnormalities. LV apical false tendon. There is a small membranous VSD with left to right flow by color doppler. There is no right to left shunting noted by saline microbubble contrast. Doppler was not performed across the VSD. Doppler parameters are consistent with abnormal left ventricular relaxation (grade 1 diastolic dysfunction). The E/e' ratio is <10, suggesting normal LV filling pressure. - Atrial septum: No defect or patent foramen ovale was identified by color doppler or saline microbubble contrast study. - Tricuspid valve: Mild regurgitation. - Pulmonary arteries: PA peak pressure: 5mm Hg (S). - Inferior vena cava: The vessel was normal in size;  the respirophasic diameter changes were in the normal range (= 50%); findings are consistent with normal central venous pressure.  EKG:  EKG is ordered today.  The ekg ordered today demonstrates normal sinus rhythm, possible old septal infarct  Recent Labs: No results found for requested labs within last 8760 hours.  Recent Lipid Panel No results found for: CHOL, TRIG, HDL, CHOLHDL, VLDL, LDLCALC, LDLDIRECT  Physical Exam:    VS:  BP 126/70   Pulse 65   Ht 5\' 10"  (1.778 m)   Wt 247 lb 6.4 oz (112.2 kg)   BMI 35.50 kg/m     Wt Readings from Last 3 Encounters:  11/23/17 247 lb 6.4 oz (112.2 kg)  10/18/17 249 lb 6.4 oz (113.1 kg)  07/07/16 228 lb (103.4 kg)     GEN: Well nourished, well developed in no acute distress HEENT: Normal NECK: No JVD; No carotid bruits LYMPHATICS: No lymphadenopathy CARDIAC: regular rhythm, normal S1 and S2, no rubs, gallops. 4/6 continuous machinelike murmur heard across the precordium. Radial and DP pulses 2+ bilaterally. RESPIRATORY:  Clear to auscultation without rales, wheezing or rhonchi  ABDOMEN: Soft, non-tender, non-distended MUSCULOSKELETAL:  No edema; No deformity  SKIN: Warm and dry NEUROLOGIC:  Alert and oriented x 3 PSYCHIATRIC:  Normal affect   ASSESSMENT:    1. Pre-procedure lab exam   2. Medication management   3. VSD (ventricular septal defect)   4. Chest pain, unspecified type    PLAN:    1. Chest pain: dullness is concerning, has risk factors, ECG concerning for possible prior infarct. With fatigue and chest pain, warrants definitive investigation -had planned for CT coronary, but given plans to do cath for VSD, will do diagnostic coronary study for definitive evaluation Risks and benefits of cardiac catheterization have been discussed with the patient.  These include bleeding, infection, kidney damage, stroke, heart attack, death.  The patient understands these risks and is willing to proceed. -pre cath BMP and CBC  ordered  3. VSD: last echo 2013. Has loud murmur today. No LE edema today, no JVD, but had a prior episode of significant LE swelling. -echo showed VSD and mild chamber dilation. Unable to estimate pressures or shunt fraction on echo -plan RHC with shunt evaluation with Dr. Haroldine Laws on 10/31.  4. Prevention: -lipids--last 2018, recheck with BMET to further risk stratify and update ASCVD risk.  Prevention: -recommend heart healthy/Mediterranean diet, with whole grains, fruits, vegetable, fish, lean meats, nuts, and olive oil. Limit salt. -recommend moderate walking, 3-5 times/week for 30-50 minutes  each session. Aim for at least 150 minutes.week. Goal should be pace of 3 miles/hours, or walking 1.5 miles in 30 minutes -recommend avoidance of tobacco products. Avoid excess alcohol. -Additional risk factor control:  -Diabetes: A1c is 5.4 05/2016  -Lipids: Tchol 220, HDL 83, LDL 118, TG 93 in 5/018. Recheck today.  -Blood pressure control: monitor, will improve with weight loss  -Weight: Class 2 obesity per BMI. We discussed, once clear from a cardiac perspective she is very motivated to lose weight.  Plan for follow up: 12 mos  Medication Adjustments/Labs and Tests Ordered: Current medicines are reviewed at length with the patient today.  Concerns regarding medicines are outlined above.  Orders Placed This Encounter  Procedures  . CBC  . Basic metabolic panel  . Lipid panel  . EKG 12-Lead   No orders of the defined types were placed in this encounter.   Patient Instructions  Medication Instructions:  Your Physician recommend you continue on your current medication as directed.    If you need a refill on your cardiac medications before your next appointment, please call your pharmacy.   Lab work: Your physician recommends that you return for lab work today (CBC, BMP, Lipid)  If you have labs (blood work) drawn today and your tests are completely normal, you will receive your  results only by: Marland Kitchen MyChart Message (if you have MyChart) OR . A paper copy in the mail If you have any lab test that is abnormal or we need to change your treatment, we will call you to review the results.  Testing/Procedures: Your physician has requested that you have a cardiac catheterization. Cardiac catheterization is used to diagnose and/or treat various heart conditions. Doctors may recommend this procedure for a number of different reasons. The most common reason is to evaluate chest pain. Chest pain can be a symptom of coronary artery disease (CAD), and cardiac catheterization can show whether plaque is narrowing or blocking your heart's arteries. This procedure is also used to evaluate the valves, as well as measure the blood flow and oxygen levels in different parts of your heart. For further information please visit HugeFiesta.tn. Please follow instruction sheet, as given. [facility]    Follow-Up: At [facility], you and your health needs are our priority.  As part of our continuing mission to provide you with exceptional heart care, we have created designated Provider Care Teams.  These Care Teams include your primary Cardiologist (physician) and Advanced Practice Providers (APPs -  Physician Assistants and Nurse Practitioners) who all work together to provide you with the care you need, when you need it. You will need a follow up appointment in 1 years.  Please call our office 2 months in advance to schedule this appointment.  You may see Buford Dresser, MD or one of the following Advanced Practice Providers on your designated Care Team:   Rosaria Ferries, PA-C . Jory Sims, DNP, ANP  Any Other Special Instructions Will Be Listed Below (If Applicable).     [place] [address] Dept: [phone] Loc: [place]  11/23/2017  You are scheduled for a Cardiac Catheterization on Thursday, October 31 with Dr. Glori Bickers.  1. Please arrive at the [facility] (Main Entrance A) at [facility]: [address] [place], [place] [address] at 5:30 AM (This time is two hours before your procedure to ensure your  preparation). Free valet parking service is available.   Special note: Every effort is made to have your procedure done on time. Please understand that emergencies sometimes delay scheduled procedures.  2. Diet: Do not eat solid foods after midnight.  The patient may have clear liquids until 5am upon the day of the procedure.  3. Labs: You will need to have blood drawn on Today CBC, BMP  4. Medication instructions in preparation for your procedure:   Contrast Allergy: No   On the morning of your procedure, take your Aspirin and any morning medicines NOT listed above.  You may use sips of water.  5. Plan for one night stay--bring personal belongings. 6. Bring a current list of your medications and current insurance cards. 7. You MUST have a responsible person to drive you home. 8. Someone MUST be with you the first 24 hours after you arrive home or your discharge will be delayed. 9. Please wear clothes that are easy to get on and off and wear slip-on shoes.  Thank you for allowing [place] to care for you!   -- [place] [facility]       Signed, Buford Dresser, MD PhD 11/23/2017 12:14 PM    [place]

## 2017-11-23 NOTE — Patient Instructions (Signed)
Medication Instructions:  Your Physician recommend you continue on your current medication as directed.    If you need a refill on your cardiac medications before your next appointment, please call your pharmacy.   Lab work: Your physician recommends that you return for lab work today (CBC, BMP, Lipid)  If you have labs (blood work) drawn today and your tests are completely normal, you will receive your results only by: Marland Kitchen MyChart Message (if you have MyChart) OR . A paper copy in the mail If you have any lab test that is abnormal or we need to change your treatment, we will call you to review the results.  Testing/Procedures: Your physician has requested that you have a cardiac catheterization. Cardiac catheterization is used to diagnose and/or treat various heart conditions. Doctors may recommend this procedure for a number of different reasons. The most common reason is to evaluate chest pain. Chest pain can be a symptom of coronary artery disease (CAD), and cardiac catheterization can show whether plaque is narrowing or blocking your heart's arteries. This procedure is also used to evaluate the valves, as well as measure the blood flow and oxygen levels in different parts of your heart. For further information please visit HugeFiesta.tn. Please follow instruction sheet, as given. [facility]    Follow-Up: At [facility], you and your health needs are our priority.  As part of our continuing mission to provide you with exceptional heart care, we have created designated Provider Care Teams.  These Care Teams include your primary Cardiologist (physician) and Advanced Practice Providers (APPs -  Physician Assistants and Nurse Practitioners) who all work together to provide you with the care you need, when you need it. You will need a follow up appointment in 1 years.  Please call our office 2 months in advance to schedule this appointment.  You may see Buford Dresser, MD  or one of the following Advanced Practice Providers on your designated Care Team:   Rosaria Ferries, PA-C . Jory Sims, DNP, ANP  Any Other Special Instructions Will Be Listed Below (If Applicable).     [place] [address] Dept: [phone] Loc: [place]  11/23/2017  You are scheduled for a Cardiac Catheterization on Thursday, October 31 with Dr. Glori Bickers.  1. Please arrive at the [facility] (Main Entrance A) at [facility]: [address] [place], [place] [address] at 5:30 AM (This time is two hours before your procedure to ensure your preparation). Free valet parking service is available.   Special note: Every effort is made to have your procedure done on time. Please understand that emergencies sometimes delay scheduled procedures.  2. Diet: Do not eat solid foods after midnight.  The patient may have clear liquids until 5am upon the day of the procedure.  3. Labs: You will need to have blood drawn on Today CBC, BMP  4. Medication instructions in preparation for your procedure:   Contrast Allergy: No   On the morning of your procedure, take your Aspirin and any morning medicines NOT listed above.  You may use sips of water.  5. Plan for one night stay--bring personal belongings. 6. Bring a current list of your medications and current insurance cards. 7. You MUST have a responsible person to drive you home. 8. Someone MUST be with you the first 24 hours after you arrive home or your discharge will  be delayed. 9. Please wear clothes that are easy to get on and off and wear slip-on shoes.  Thank you for allowing [place] to care for you!   -- [place] [facility]

## 2017-11-24 LAB — CBC
Hematocrit: 37.9 % (ref 34.0–46.6)
Hemoglobin: 12.8 g/dL (ref 11.1–15.9)
MCH: 28.8 pg (ref 26.6–33.0)
MCHC: 33.8 g/dL (ref 31.5–35.7)
MCV: 85 fL (ref 79–97)
Platelets: 261 10*3/uL (ref 150–450)
RBC: 4.45 x10E6/uL (ref 3.77–5.28)
RDW: 13.3 % (ref 12.3–15.4)
WBC: 6.2 10*3/uL (ref 3.4–10.8)

## 2017-11-24 LAB — BASIC METABOLIC PANEL
BUN/Creatinine Ratio: 18 (ref 12–28)
BUN: 14 mg/dL (ref 8–27)
CALCIUM: 9.3 mg/dL (ref 8.7–10.3)
CO2: 22 mmol/L (ref 20–29)
Chloride: 102 mmol/L (ref 96–106)
Creatinine, Ser: 0.77 mg/dL (ref 0.57–1.00)
GFR calc non Af Amer: 84 mL/min/{1.73_m2} (ref >59)
GFR, EST AFRICAN AMERICAN: 97 mL/min/{1.73_m2} (ref >59)
Glucose: 91 mg/dL (ref 65–99)
POTASSIUM: 4.7 mmol/L (ref 3.5–5.2)
Sodium: 141 mmol/L (ref 134–144)

## 2017-11-24 LAB — LIPID PANEL
Chol/HDL Ratio: 2.6 ratio (ref 0.0–4.4)
Cholesterol, Total: 217 mg/dL — ABNORMAL HIGH (ref 100–199)
HDL: 83 mg/dL (ref >39)
LDL Calculated: 120 mg/dL — ABNORMAL HIGH (ref 0–99)
TRIGLYCERIDES: 69 mg/dL (ref 0–149)
VLDL Cholesterol Cal: 14 mg/dL (ref 5–40)

## 2017-11-25 ENCOUNTER — Encounter (HOSPITAL_COMMUNITY): Payer: Self-pay | Admitting: Internal Medicine

## 2017-11-25 ENCOUNTER — Other Ambulatory Visit: Payer: Self-pay

## 2017-11-25 ENCOUNTER — Encounter (HOSPITAL_COMMUNITY)
Admission: RE | Disposition: A | Discharge status: Home / Self Care | Payer: Self-pay | Source: Ambulatory Visit | Attending: Internal Medicine

## 2017-11-25 ENCOUNTER — Ambulatory Visit (HOSPITAL_COMMUNITY)
Admission: RE | Admit: 2017-11-25 | Discharge: 2017-11-25 | Disposition: A | Discharge status: Home / Self Care | Payer: 59 | Source: Ambulatory Visit | Attending: Internal Medicine | Admitting: Internal Medicine

## 2017-11-25 DIAGNOSIS — Z6835 Body mass index (BMI) 35.0-35.9, adult: Secondary | ICD-10-CM | POA: Diagnosis not present

## 2017-11-25 DIAGNOSIS — E669 Obesity, unspecified: Secondary | ICD-10-CM | POA: Insufficient documentation

## 2017-11-25 DIAGNOSIS — K219 Gastro-esophageal reflux disease without esophagitis: Secondary | ICD-10-CM | POA: Diagnosis not present

## 2017-11-25 DIAGNOSIS — I10 Essential (primary) hypertension: Secondary | ICD-10-CM | POA: Diagnosis not present

## 2017-11-25 DIAGNOSIS — J45909 Unspecified asthma, uncomplicated: Secondary | ICD-10-CM | POA: Diagnosis not present

## 2017-11-25 DIAGNOSIS — Q210 Ventricular septal defect: Secondary | ICD-10-CM | POA: Diagnosis not present

## 2017-11-25 DIAGNOSIS — R0789 Other chest pain: Secondary | ICD-10-CM | POA: Insufficient documentation

## 2017-11-25 DIAGNOSIS — Z7982 Long term (current) use of aspirin: Secondary | ICD-10-CM | POA: Insufficient documentation

## 2017-11-25 HISTORY — PX: RIGHT/LEFT HEART CATH AND CORONARY ANGIOGRAPHY: CATH118266

## 2017-11-25 LAB — POCT I-STAT 3, ART BLOOD GAS (G3+)
ACID-BASE DEFICIT: 1 mmol/L (ref 0.0–2.0)
Acid-base deficit: 1 mmol/L (ref 0.0–2.0)
Bicarbonate: 24.6 mmol/L (ref 20.0–28.0)
Bicarbonate: 25.5 mmol/L (ref 20.0–28.0)
O2 SAT: 93 %
O2 Saturation: 68 %
PCO2 ART: 43.8 mmHg (ref 32.0–48.0)
PCO2 ART: 47.3 mmHg (ref 32.0–48.0)
PH ART: 7.34 — AB (ref 7.350–7.450)
PO2 ART: 38 mmHg — AB (ref 83.0–108.0)
TCO2: 26 mmol/L (ref 22–32)
TCO2: 27 mmol/L (ref 22–32)
pH, Arterial: 7.357 (ref 7.350–7.450)
pO2, Arterial: 72 mmHg — ABNORMAL LOW (ref 83.0–108.0)

## 2017-11-25 LAB — POCT I-STAT 3, VENOUS BLOOD GAS (G3P V)
Acid-base deficit: 1 mmol/L (ref 0.0–2.0)
Acid-base deficit: 1 mmol/L (ref 0.0–2.0)
BICARBONATE: 25 mmol/L (ref 20.0–28.0)
Bicarbonate: 25.9 mmol/L (ref 20.0–28.0)
Bicarbonate: 25.2 mmol/L (ref 20.0–28.0)
O2 SAT: 77 %
O2 Saturation: 80 %
O2 Saturation: 82 %
PCO2 VEN: 46 mmHg (ref 44.0–60.0)
PCO2 VEN: 47 mmHg (ref 44.0–60.0)
TCO2: 27 mmol/L (ref 22–32)
TCO2: 26 mmol/L (ref 22–32)
TCO2: 27 mmol/L (ref 22–32)
pCO2, Ven: 45.3 mmHg (ref 44.0–60.0)
pH, Ven: 7.349 (ref 7.250–7.430)
pH, Ven: 7.35 (ref 7.250–7.430)
pH, Ven: 7.347 (ref 7.250–7.430)
pO2, Ven: 45 mmHg (ref 32.0–45.0)
pO2, Ven: 47 mmHg — ABNORMAL HIGH (ref 32.0–45.0)
pO2, Ven: 49 mmHg — ABNORMAL HIGH (ref 32.0–45.0)

## 2017-11-25 SURGERY — RIGHT/LEFT HEART CATH AND CORONARY ANGIOGRAPHY
Anesthesia: LOCAL

## 2017-11-25 MED ORDER — MIDAZOLAM HCL 2 MG/2ML IJ SOLN
INTRAMUSCULAR | Status: DC | PRN
  Administered 2017-11-25: 2 mg via INTRAVENOUS

## 2017-11-25 MED ORDER — VERAPAMIL HCL 2.5 MG/ML IV SOLN
INTRAVENOUS | Status: DC | PRN
  Administered 2017-11-25: 10 mL via INTRA_ARTERIAL

## 2017-11-25 MED ORDER — FENTANYL CITRATE (PF) 100 MCG/2ML IJ SOLN
INTRAMUSCULAR | Status: AC
  Filled 2017-11-25: qty 2

## 2017-11-25 MED ORDER — SODIUM CHLORIDE 0.9 % WEIGHT BASED INFUSION
3.0000 mL/kg/h | INTRAVENOUS | Status: AC
  Administered 2017-11-25: 3 mL/kg/h via INTRAVENOUS

## 2017-11-25 MED ORDER — LIDOCAINE HCL (PF) 1 % IJ SOLN
INTRAMUSCULAR | Status: DC | PRN
  Administered 2017-11-25 (×2): 2 mL

## 2017-11-25 MED ORDER — MIDAZOLAM HCL 2 MG/2ML IJ SOLN
INTRAMUSCULAR | Status: AC
  Filled 2017-11-25: qty 2

## 2017-11-25 MED ORDER — SODIUM CHLORIDE 0.9 % IV SOLN: 250.0000 mL | INTRAVENOUS | Status: DC | PRN

## 2017-11-25 MED ORDER — VERAPAMIL HCL 2.5 MG/ML IV SOLN
INTRAVENOUS | Status: AC
  Filled 2017-11-25: qty 2

## 2017-11-25 MED ORDER — LIDOCAINE HCL (PF) 1 % IJ SOLN
INTRAMUSCULAR | Status: AC
  Filled 2017-11-25: qty 30

## 2017-11-25 MED ORDER — SODIUM CHLORIDE 0.9% FLUSH: 3.0000 mL | Freq: Two times a day (BID) | INTRAVENOUS | Status: DC

## 2017-11-25 MED ORDER — SODIUM CHLORIDE 0.9% FLUSH: 3.0000 mL | INTRAVENOUS | Status: DC | PRN

## 2017-11-25 MED ORDER — IOHEXOL 350 MG/ML SOLN
INTRAVENOUS | Status: DC | PRN
  Administered 2017-11-25: 30 mL via INTRA_ARTERIAL

## 2017-11-25 MED ORDER — HEPARIN SODIUM (PORCINE) 1000 UNIT/ML IJ SOLN
INTRAMUSCULAR | Status: DC | PRN
  Administered 2017-11-25: 5000 [IU] via INTRAVENOUS

## 2017-11-25 MED ORDER — HEPARIN (PORCINE) IN NACL 1000-0.9 UT/500ML-% IV SOLN
INTRAVENOUS | Status: AC
  Filled 2017-11-25: qty 1000

## 2017-11-25 MED ORDER — HEPARIN (PORCINE) IN NACL 1000-0.9 UT/500ML-% IV SOLN
INTRAVENOUS | Status: DC | PRN
  Administered 2017-11-25 (×2): 500 mL

## 2017-11-25 MED ORDER — SODIUM CHLORIDE 0.9 % WEIGHT BASED INFUSION: 1.0000 mL/kg/h | INTRAVENOUS | Status: DC

## 2017-11-25 MED ORDER — FENTANYL CITRATE (PF) 100 MCG/2ML IJ SOLN
INTRAMUSCULAR | Status: DC | PRN
  Administered 2017-11-25: 25 ug via INTRAVENOUS

## 2017-11-25 SURGICAL SUPPLY — 12 items

## 2017-11-25 NOTE — Interval H&P Note (Signed)
History and Physical Interval Note:  11/25/2017 7:47 AM  Rebecca Stephens  has presented today for surgery, with the diagnosis of vsd  The various methods of treatment have been discussed with the patient and family. After consideration of risks, benefits and other options for treatment, the patient has consented to  Procedure(s): RIGHT/LEFT HEART CATH AND CORONARY ANGIOGRAPHY (N/A) and possible coronary angioplasty as a surgical intervention .  The patient's history has been reviewed, patient examined, no change in status, stable for surgery.  I have reviewed the patient's chart and labs.  Questions were answered to the patient's satisfaction.     [name]

## 2017-11-25 NOTE — Discharge Instructions (Signed)

## 2017-11-26 LAB — POCT I-STAT 3, VENOUS BLOOD GAS (G3P V)
Acid-base deficit: 1 mmol/L (ref 0.0–2.0)
Acid-base deficit: 2 mmol/L (ref 0.0–2.0)
Bicarbonate: 25.2 mmol/L (ref 20.0–28.0)
Bicarbonate: 24.3 mmol/L (ref 20.0–28.0)
O2 Saturation: 65 %
O2 Saturation: 76 %
PCO2 VEN: 44.8 mmHg (ref 44.0–60.0)
PH VEN: 7.345 (ref 7.250–7.430)
PH VEN: 7.342 (ref 7.250–7.430)
PO2 VEN: 44 mmHg (ref 32.0–45.0)
TCO2: 27 mmol/L (ref 22–32)
TCO2: 26 mmol/L (ref 22–32)
pCO2, Ven: 46.1 mmHg (ref 44.0–60.0)
pO2, Ven: 36 mmHg (ref 32.0–45.0)

## 2017-11-26 LAB — POCT I-STAT 3, ART BLOOD GAS (G3+)
Bicarbonate: 25.9 mmol/L (ref 20.0–28.0)
O2 SAT: 71 %
PCO2 ART: 48.1 mmHg — AB (ref 32.0–48.0)
TCO2: 27 mmol/L (ref 22–32)
pH, Arterial: 7.339 — ABNORMAL LOW (ref 7.350–7.450)
pO2, Arterial: 40 mmHg — CL (ref 83.0–108.0)

## 2017-12-02 ENCOUNTER — Telehealth: Payer: Self-pay

## 2017-12-02 NOTE — Telephone Encounter (Signed)
Pt updated with lab results along with Dr. Judeth Cornfield recommendation. Pt verbalized understanding and states she would like to work on lifestyle changes and return in 6 months.

## 2017-12-02 NOTE — Telephone Encounter (Signed)
-----   Message from Buford Dresser, MD sent at 12/02/2017 11:35 AM EST ----- Regarding: cholesterol Based on the cath, there isn't significant coronary disease right now that needs [place] to be super aggressive. There are two options: we can given it time to work on lifestyle changes/weight loss and recheck cholesterol at the next visit, or we can start a low dose of pravastatin. I am ok with either option. I know she was very motivated to lose weight if her heart testing was normal. Happy to bring her back in 6 mos (instead of 12) if she wants to check out her lifestyle progress sooner. Thanks!

## 2018-05-19 ENCOUNTER — Telehealth: Payer: 59 | Admitting: Cardiology

## 2018-10-25 ENCOUNTER — Encounter: Payer: Self-pay | Admitting: Gynecology

## 2018-10-31 ENCOUNTER — Ambulatory Visit: Payer: Commercial Managed Care - PPO | Admitting: Medical

## 2018-10-31 ENCOUNTER — Other Ambulatory Visit: Payer: Self-pay

## 2018-10-31 ENCOUNTER — Telehealth: Payer: Self-pay | Admitting: Cardiology

## 2018-10-31 ENCOUNTER — Encounter: Payer: Self-pay | Admitting: Medical

## 2018-10-31 VITALS — BP 121/74 | HR 71 | Temp 97.7°F | Ht 70.0 in | Wt 244.0 lb

## 2018-10-31 DIAGNOSIS — R079 Chest pain, unspecified: Secondary | ICD-10-CM | POA: Diagnosis not present

## 2018-10-31 DIAGNOSIS — Q210 Ventricular septal defect: Secondary | ICD-10-CM | POA: Diagnosis not present

## 2018-10-31 DIAGNOSIS — R002 Palpitations: Secondary | ICD-10-CM

## 2018-10-31 NOTE — Patient Instructions (Signed)
Medication Instructions:  Your physician recommends that you continue on your current medications as directed. Please refer to the Current Medication list given to you today.  If you need a refill on your cardiac medications before your next appointment, please call your pharmacy.    Testing/Procedures: Your physician has requested that you have an echocardiogram. Echocardiography is a painless test that uses sound waves to create images of your heart. It provides your doctor with information about the size and shape of your heart and how well your heart's chambers and valves are working. This procedure takes approximately one hour. There are no restrictions for this procedure.  Your physician has recommended that you wear a 30 day event monitor (This will be mailed to you with instructions. Please call if you have any questions or need help with the monitor). Event monitors are medical devices that record the heart's electrical activity. Doctors most often [place] these monitors to diagnose arrhythmias. Arrhythmias are problems with the speed or rhythm of the heartbeat. The monitor is a small, portable device. You can wear one while you do your normal daily activities. This is usually used to diagnose what is causing palpitations/syncope (passing out).   Follow-Up: At [facility], you and your health needs are our priority.  As part of our continuing mission to provide you with exceptional heart care, we have created designated Provider Care Teams.  These Care Teams include your primary Cardiologist (physician) and Advanced Practice Providers (APPs -  Physician Assistants and Nurse Practitioners) who all work together to provide you with the care you need, when you need it. . Please schedule a 2 month follow-up appointment with Roby Lofts, PA.  Any Other Special Instructions Will Be Listed Below (If Applicable). Your physician has requested that you regularly monitor and record your blood  pressure readings at home. Please use the same machine at the same time of day to check your readings and record them to bring to your follow-up visit. Please call our office if your blood pressure is consistently 130/80 or above.

## 2018-10-31 NOTE — Progress Notes (Signed)
Cardiology Office Note   Date:  10/31/2018   ID:  LIVYA SHADY, DOB [birth year], MRN [id number]  PCP:  Hulan Fess, MD  Cardiologist:  Buford Dresser, MD EP: None  Chief Complaint  Patient presents with   Palpitations   Chest Pain      History of Present Illness: Rebecca Stephens is a 63 y.o. female with a PMH of HTN, membranous VSD, breast cancer s/p tamoxifen, asthma/bronchitis,  who presents for the evaluation of chest pain and palpitations.  She was last evaluated by Dr. Frederik Pear 10/2017 at which time she had a recent echocardiogram which revealed a VSD and mild chamber dilation. She was recommended to undergo a R/LHC which showed normal coronaries, very mild PAH, and small to moderate membranous VSD without significant RA/RV volume overload or dysfunction. She was recommended to undergo a sleep study and to repeat an echo in 6-12 months. She has not undergone either study.   She contacted our office today with complaints of chest pain yesterday morning after climbing a flight of stairs, as well as a racing heart beat and palpitations for <5 minutes. She continued to have several episodes of palpitations throughout the day prompting her to arrange this visit. She reported having a posterior headache yesterday at this time of initial episode with associated nausea. No other neurologic complaints of dizziness, syncope, unilateral weakness, slurred speech, or facial drooping. She reported laying in bed last night when palpitations started again lasting for ~5 minutes before resolving spontaneously. No complaints of SOB, DOE, diaphoresis, orthopnea, PND, or LE edema.    Past Medical History:  Diagnosis Date   Asthma    Bronchitis    Cancer ([place])    Breast   DCIS (ductal carcinoma in situ) of breast 2011   right breast   Heart disease    Hypertension    VSD (ventricular septal defect)     Past Surgical History:  Procedure Laterality Date   BREAST  SURGERY     carcinoma removal   CARDIAC CATHETERIZATION     MOUTH SURGERY     bone removed from under tongue   RIGHT/LEFT HEART CATH AND CORONARY ANGIOGRAPHY N/A 11/25/2017   Procedure: RIGHT/LEFT HEART CATH AND CORONARY ANGIOGRAPHY;  Surgeon: Jolaine Artist, MD;  Location: [facility];  Service: Cardiovascular;  Laterality: N/A;   TOTAL HIP ARTHROPLASTY  2004   right hip     Current Outpatient Medications  Medication Sig Dispense Refill   ALPRAZolam (XANAX) 0.5 MG tablet TAKE 1 TABLET BY MOUTH NIGHTLY AS NEEDED FOR SLEEP FOR UP TO 30 DAYS     aspirin EC 81 MG tablet Take 81 mg by mouth daily.     escitalopram (LEXAPRO) 10 MG tablet Take 10 mg by mouth at bedtime.      esomeprazole (NEXIUM) 20 MG capsule Take 20 mg by mouth daily.      Melatonin 10 MG TABS Take 20 mg by mouth at bedtime as needed (sleep).     metoprolol tartrate (LOPRESSOR) 50 MG tablet TAKE 1 TABLET 2 HR PRIOR TO CARDIAC PROCEDURE 1 tablet 0   No current facility-administered medications for this visit.     Allergies:   Patient has no known allergies.    Social History:  The patient  reports that she has never smoked. She has never used smokeless tobacco. She reports current alcohol use of about 2.0 standard drinks of alcohol per week. She reports that she does not use drugs.  Family History:  The patient's family history includes Breast cancer (age of onset: 69) in her maternal grandmother; Breast cancer (age of onset: 46) in her mother; Cancer in her maternal grandfather and paternal grandfather; Clotting disorder in her father; Stroke in her paternal grandfather and paternal grandmother.    ROS:  Please see the history of present illness.   Otherwise, review of systems are positive for none.   All other systems are reviewed and negative.    PHYSICAL EXAM: VS:  BP 121/74    Pulse 71    Temp 97.7 F (36.5 C)    Ht 5\' 10"  (1.778 m)    Wt 244 lb (110.7 kg)    SpO2 97%    BMI 35.01 kg/m  ,  BMI Body mass index is 35.01 kg/m. GEN: Well nourished, well developed, in no acute distress HEENT: normal Neck: no JVD, carotid bruits, or masses Cardiac: RRR; +murmur, no rubs or gallops, no edema, mild TTP of central chest wall Respiratory:  clear to auscultation bilaterally, normal work of breathing GI: soft, obese, nontender, nondistended, + BS MS: no deformity or atrophy Skin: warm and dry, no rash Neuro:  Strength and sensation are intact Psych: euthymic mood, full affect   EKG:  EKG is ordered today. The ekg ordered today demonstrates NSR, rate 69 bpm, QTc 447, baseline wander in II and III, no STE/D, no TWI, no significant change from previous.    Recent Labs: 11/23/2017: BUN 14; Creatinine, Ser 0.77; Hemoglobin 12.8; Platelets 261; Potassium 4.7; Sodium 141    Lipid Panel    Component Value Date/Time   CHOL 217 (H) 11/23/2017 1226   TRIG 69 11/23/2017 1226   HDL 83 11/23/2017 1226   CHOLHDL 2.6 11/23/2017 1226   LDLCALC 120 (H) 11/23/2017 1226      Wt Readings from Last 3 Encounters:  10/31/18 244 lb (110.7 kg)  11/25/17 247 lb (112 kg)  11/23/17 247 lb 6.4 oz (112.2 kg)      Other studies Reviewed: Additional studies/ records that were reviewed today include:   Right/Left Heart Catheterization 10/2017: Assessment:  1. Normal coronaries 2. LVEF 60-65% 3. Very mild PAH 4. Small to moderate left to right shunt through membranous VSD with Qp/QS 1.56  Plan/Discussion:  Shunt through the VSD is small to moderate without significant RA/RV volume overload or dysfunction. There is mild PAH which may be due in part to sleep apnea. Would continue medical therapy. Plan sleep study. Repeat echo in 6-12 months. If R-side continues to dilate can repeat cath with eye toward VSD patch as needed.   Echocardiogram 10/2017: - Left ventricle: The cavity size was mildly dilated. Wall   thickness was normal. Systolic function was normal. The estimated   ejection  fraction was in the range of 55% to 60%. Wall motion was   normal; there were no regional wall motion abnormalities. Left   ventricular diastolic function parameters were normal. - Left atrium: The atrium was mildly dilated.  Impressions:  - Normal LV function; small perimembranous VSD with peak velocity   5.4 m/s; mild LVE; mild LAE.  ASSESSMENT AND PLAN:  1. Chest pain: patient noted chest tightness with associated racing heart beat sensation. Still with a mild ache in her chest at this time which is tender to palpation. Likely not cardiac in nature given normal coronary arteries on [facility] 10/2017.  - No further ischemic evaluation at this time  2. Palpitations: patient reports sudden onset racing heart beat sensation yesterday  morning when walking up a flight of stairs. She continued to have intermittent episodes lasting ~5 minutes yesterday but no recurrence today. She is at higher risk for arrhythmias with VSD history. EKG with NSR today.  - Will plan for a 30 day event monitor to further evaluate symptoms.  3. HTN: BP 121/74 today. She is not on any antihypertensive medications. She reported elevated readings yesterday wit BP in the 170s/80s when she wasn't feeling well.  - Recommended continued monitoring of BP and to notify the office if persistently >130/80  4. VSD: small to moderate on LHC without significant RA/RV volume overload or dysfunction (10/2017). Recommended for repeat echo in 6-12 months. - Will repeat an echocardiogram for close monitoring    Current medicines are reviewed at length with the patient today.  The patient does not have concerns regarding medicines.  The following changes have been made:  no change  Labs/ tests ordered today include:   Orders Placed This Encounter  Procedures   CARDIAC EVENT MONITOR   EKG 12-Lead   ECHOCARDIOGRAM COMPLETE     Disposition:   FU with me in 2 months  Signed, Abigail Butts, PA-C  10/31/2018 4:14 PM

## 2018-10-31 NOTE — Telephone Encounter (Signed)
    COVID-19 Pre-Screening Questions:  . In the past 7 to 10 days have you had a cough,  shortness of breath, headache, congestion, fever (100 or greater) body aches, chills, sore throat, or sudden loss of taste or sense of smell? NO; HEADACHE yesterday only . Have you been around anyone with known Covid 19? . NO . Have you been around anyone who is awaiting Covid 19 test results in the past 7 to 10 days? NO . Have you been around anyone who has been exposed to Covid 19, or has mentioned symptoms of Covid 19 within the past 7 to 10 days? NO  If you have any concerns/questions about symptoms patients report during screening (either on the phone or at threshold). Contact the provider seeing the patient or DOD for further guidance.  If neither are available contact a member of the leadership team.   Spoke with pt who states that she went out of town yesterday and around 1000 she had 'really deep chest pain' and that after climbing a flight of steps she felt her heart racing and her head felt 'like someone hand their hand on it and was gripping'. Pt states she took two baby ASA on the way back home and that it took a while for her to start feeling better, but that she did have fluttering sensation to heart that lasted less than 5 min. She states she took an additional baby ASA, got home, and got in her bed. She states she did have several episodes of fluttering sensation last night. BP 173/70 and HR 117 yesterday. She states she does not have headache, CP, or fluttering sensation this morning but still feels some heaviness/achiness to her chest, that feels like she overexerted herself. No SOB. Pt states she checked vitals earlier this morning; BP 140/86 and HR 78. Pt checked vitals while on the phone with triage nurse. BP 125/88 HR 80. She states she was advised by PCP yesterday to report to ED for eval but does not want to do so d/t her medical Hx and increased exposure to COVID-19. She states she would  like EKG in [facility] office. Informed her that she should report to ED with active CP. Pt would like appt. Pt scheduled to see Roby Lofts, PA-C at 3:00 pm today. She is aware of mask and guest limit policy. Advised her to call 911 if symptoms return prior to appt. Pt verbalized understanding

## 2018-10-31 NOTE — Telephone Encounter (Signed)
°  Patient states she called her PCP and they instructed her to go to ED for eval and EKG, patient declined to go Patient requesting EKG    Pt c/o BP issue:  1. What are your last 5 BP readings?  173/70 HR 117 140/86 HR 78  2. Are you having any other symptoms (ex. Dizziness, headache, blurred vision, passed out)? Headache yesterday, chest tightness at times 3. What is your medication issue?  Patient requesting EKG

## 2018-11-03 ENCOUNTER — Telehealth: Payer: Self-pay

## 2018-11-03 NOTE — Telephone Encounter (Signed)
Left brief message about monitor instructions. Ordered 30 day [employer] Event Monitor to be mailed to pt's home.

## 2018-11-07 ENCOUNTER — Ambulatory Visit (HOSPITAL_COMMUNITY): Payer: Commercial Managed Care - PPO | Attending: Cardiology

## 2018-11-07 ENCOUNTER — Other Ambulatory Visit: Payer: Self-pay

## 2018-11-07 DIAGNOSIS — Q210 Ventricular septal defect: Secondary | ICD-10-CM | POA: Diagnosis present

## 2018-11-07 DIAGNOSIS — R002 Palpitations: Secondary | ICD-10-CM | POA: Diagnosis present

## 2018-11-07 NOTE — Progress Notes (Signed)
2D Echocardiogram has been performed. [facility] Shane Melby RDCS 11/07/18  11:00

## 2018-11-08 NOTE — Progress Notes (Signed)
The patient has been notified of the result and verbalized understanding.  All questions (if any) were answered. Jacqulynn Cadet, [place] 11/08/2018 5:19 PM

## 2018-11-14 ENCOUNTER — Ambulatory Visit (INDEPENDENT_AMBULATORY_CARE_PROVIDER_SITE_OTHER): Payer: Commercial Managed Care - PPO

## 2018-11-14 DIAGNOSIS — R002 Palpitations: Secondary | ICD-10-CM | POA: Diagnosis not present

## 2018-11-24 ENCOUNTER — Telehealth: Payer: Self-pay | Admitting: Cardiology

## 2018-11-24 NOTE — Telephone Encounter (Signed)
Returned call to pt she states that she states that she is getting blisters from the monitor leads. She also states that she does not think that she needs to continue wearing the monitor because she has not had any sx/reactions that she was having to wear the monitor. I informed pt that this a another reason to CONTINUE to wear the monitor to further track and see if she DOES have any sx in the next 10+ days. Also told her to call the monitor co to get the anti-allergy leads so they do not give her redness/blisters and to discuss the monitor pricing with [employer].(as directed by our billing department) As she inquired about the price of the monitor.

## 2018-11-24 NOTE — Telephone Encounter (Signed)
New Message  Patient is calling stating that she had an allergic reaction to the adhesive for the monitor (rash and blisters) and has removed the monitor. Patient wants to make sure that she has wore it long enough to get results before she mails it back. Please give patient a call to confirm.

## 2019-03-27 ENCOUNTER — Ambulatory Visit: Payer: Commercial Managed Care - PPO | Admitting: Medical

## 2019-04-12 ENCOUNTER — Encounter: Payer: Self-pay | Admitting: Cardiology

## 2019-04-12 ENCOUNTER — Ambulatory Visit: Payer: Commercial Managed Care - PPO | Admitting: Cardiology

## 2019-04-12 ENCOUNTER — Other Ambulatory Visit: Payer: Self-pay

## 2019-04-12 VITALS — BP 114/72 | HR 84 | Temp 97.2°F | Ht 70.0 in | Wt 233.8 lb

## 2019-04-12 DIAGNOSIS — Z6833 Body mass index (BMI) 33.0-33.9, adult: Secondary | ICD-10-CM

## 2019-04-12 DIAGNOSIS — E6609 Other obesity due to excess calories: Secondary | ICD-10-CM

## 2019-04-12 DIAGNOSIS — Z8616 Personal history of COVID-19: Secondary | ICD-10-CM | POA: Diagnosis not present

## 2019-04-12 DIAGNOSIS — Q210 Ventricular septal defect: Secondary | ICD-10-CM | POA: Diagnosis not present

## 2019-04-12 DIAGNOSIS — R079 Chest pain, unspecified: Secondary | ICD-10-CM

## 2019-04-12 NOTE — Patient Instructions (Addendum)
Medication Instructions:  Your Physician recommend you continue on your current medication as directed.    *If you need a refill on your cardiac medications before your next appointment, please call your pharmacy*   Lab Work: None   Testing/Procedures: None   Follow-Up: At [facility], you and your health needs are our priority.  As part of our continuing mission to provide you with exceptional heart care, we have created designated Provider Care Teams.  These Care Teams include your primary Cardiologist (physician) and Advanced Practice Providers (APPs -  Physician Assistants and Nurse Practitioners) who all work together to provide you with the care you need, when you need it.  We recommend signing up for the patient portal called "MyChart".  Sign up information is provided on this After Visit Summary.  MyChart is used to connect with patients for Virtual Visits (Telemedicine).  Patients are able to view lab/test results, encounter notes, upcoming appointments, etc.  Non-urgent messages can be sent to your provider as well.   To learn more about what you can do with MyChart, go to [email or web address].    Your next appointment:   1 year(s)  The format for your next appointment:   In Person  Provider:   Buford Dresser, MD   Other Instructions If you have chest pain again, go to ER, and they can do an ECG and a troponin (we use high sensitivity in [place] and larger hospitals in the area, other hospitals use the traditional troponin assay) and tell you rather quickly whether this is your heart.

## 2019-04-12 NOTE — Progress Notes (Signed)
Cardiology Office Note:    Date:  04/12/2019   ID:  Rebecca Stephens, DOB 1955-02-20, MRN [id number]  PCP:  Vernie Shanks, MD  Cardiologist:  Buford Dresser, MD PhD  Referring MD: Vernie Shanks, MD   CC: follow up  History of Present Illness:    Rebecca Stephens is a 64 y.o. female with a hx of asthma/bronchitis, prior breast cancer s/p tamoxifen, hypertension, membranous VSD who is seen in follow up for the evaluation and management of chest pain and VSD. She was seen for initial consult on 10/18/17.   Cardiac history: Known VSD. At initial visit, noted to have chest pain and fatigue for about 6 months. Chest pain is dull, occurs about twice per day for 20 mins each time. Seems to occur at night/lying down but can be random. Not related to exertion, no associated nausea/vomiting/diaphoresis/shortness of breath. Stress test in 2013 here, no significant abnormalities. Had [facility] 10/2017, VSD shunt Qp/Qs 1.56 (less than 2, which is recommendation for repair), no coronary disease. Had symptoms of racing/chest pain 10/2018, Echo without significant change, monitor unremarkable.  Today: Both she and her husband had Covid in 12/2018, treated with steroids as an outpatient. She recovered well but her husband has continued to have breathing issues. Scheduled to get her Covid vaccine tomorrow. We discussed this today.   Working on continued weight loss.  Had one episode when she was at the beach last year, had her heart race and was very short of breath with lightheadedness. Has chest discomfort at the time as well. Stopped and got aspirin, didn't help the pain, but by the time she got to [facility] she felt better. Heart rate continued to be elevated for a time after. She saw Roby Lofts, has a monitor and echo done. Both unremarkable/unchanged. She notes that she was very stressed at the time that this occurred.   Otherwise denies chest pain, shortness of breath at rest or with normal  exertion. No PND, orthopnea, LE edema or unexpected weight gain. No syncope.  Past Medical History:  Diagnosis Date  . Asthma   . Bronchitis   . Cancer ([place])    Breast  . DCIS (ductal carcinoma in situ) of breast 2011   right breast  . Heart disease   . Hypertension   . VSD (ventricular septal defect)     Past Surgical History:  Procedure Laterality Date  . BREAST SURGERY     carcinoma removal  . CARDIAC CATHETERIZATION    . MOUTH SURGERY     bone removed from under tongue  . RIGHT/LEFT HEART CATH AND CORONARY ANGIOGRAPHY N/A 11/25/2017   Procedure: RIGHT/LEFT HEART CATH AND CORONARY ANGIOGRAPHY;  Surgeon: Jolaine Artist, MD;  Location: [place] [facility];  Service: Cardiovascular;  Laterality: N/A;  . TOTAL HIP ARTHROPLASTY  2004   right hip    Current Medications: Current Outpatient Medications on File Prior to Visit  Medication Sig  . aspirin EC 81 MG tablet Take 81 mg by mouth daily.  Marland Kitchen escitalopram (LEXAPRO) 10 MG tablet Take 10 mg by mouth at bedtime.   Marland Kitchen esomeprazole (NEXIUM) 20 MG capsule Take 20 mg by mouth daily.   . Fluticasone-Salmeterol (ADVAIR) 250-50 MCG/DOSE AEPB   . Melatonin 10 MG TABS Take 20 mg by mouth at bedtime as needed (sleep).  . ALPRAZolam (XANAX) 0.5 MG tablet TAKE 1 TABLET BY MOUTH NIGHTLY AS NEEDED FOR SLEEP FOR UP TO 30 DAYS  . metoprolol tartrate (LOPRESSOR) 50  MG tablet TAKE 1 TABLET 2 HR PRIOR TO CARDIAC PROCEDURE (Patient not taking: Reported on 04/12/2019)   No current facility-administered medications on file prior to visit.    Now taking nexium BID  Allergies:   Patient has no known allergies.   Social History   Tobacco Use  . Smoking status: Never Smoker  . Smokeless tobacco: Never Used  Substance Use Topics  . Alcohol use: Yes    Alcohol/week: 2.0 standard drinks    Types: 2 Glasses of wine per week  . Drug use: No    Family History: The patient's family history includes Breast cancer (age of onset: 65) in her  maternal grandmother; Breast cancer (age of onset: 46) in her mother; Cancer in her maternal grandfather and paternal grandfather; Clotting disorder in her father; Stroke in her paternal grandfather and paternal grandmother.  ROS:   Please see the history of present illness.  Additional pertinent ROS otherwise negative except as noted.  EKGs/Labs/Other Studies Reviewed:    The following studies were reviewed today: Monitor 12/20/18 ~5.5 days of data recorded on [employer] monitor. Patient had a min HR of 50 bpm, max HR of 134 bpm, and avg HR of 70 bpm. Predominant underlying rhythm was Sinus Rhythm. No VT, SVT, atrial fibrillation, high degree block, or pauses noted. Isolated atrial and ventricular ectopy was rare (<1%). There was 1 triggered event, which was normal sinus rhythm. No significant arrhythmias detected.  Echo 11/07/18 1. Left ventricular ejection fraction, by visual estimation, is 55 to  60%. The left ventricle has normal function. Mildly increased left  ventricular size. Left ventricular septal wall thickness was normal. There  is no left ventricular hypertrophy.  2. The average left ventricular global longitudinal strain is -22.1 %.  3. Small membranous ventricular septal defect with left to right shunting  and peak velocity 4.8cm/s.Marland Kitchen  4. Global right ventricle has normal systolic function.The right  ventricular size is normal. No increase in right ventricular wall  thickness.  5. Left atrial size was mildly dilated.  6. Right atrial size was normal.  7. The mitral valve is normal in structure. No evidence of mitral valve  regurgitation. No evidence of mitral stenosis.  8. The tricuspid valve is normal in structure. Tricuspid valve  regurgitation was not visualized by color flow Doppler.  9. The aortic valve is normal in structure. Aortic valve regurgitation  was not visualized by color flow Doppler. Structurally normal aortic  valve, with no evidence of sclerosis  or stenosis.  10. The pulmonic valve was normal in structure. Pulmonic valve  regurgitation is trivial by color flow Doppler.  11. The inferior vena cava is normal in size with greater than 50%  respiratory variability, suggesting right atrial pressure of 3 mmHg.  12. TR signal is inadequate for assessing pulmonary artery systolic  pressure.  13. No significant change from echo 1 year ago.   R/LHC 11/25/17 Findings:  Ao = 123/66 (90) LV = 118/15 RA = 7 RV = 46/9 PA = 41/16 (25) PCW = 11 Fick cardiac output/index = 6.23/2.73 PVR = 2.25 Ao sat = 95% RA sat = 71%, 65%, 68% RV sat = 77%, 82% PA sat = 80%, 76% Qp/Qs = 1.56   Assessment:  1. Normal coronaries 2. LVEF 60-65% 3. Very mild PAH 4. Small to moderate left to right shunt through membranous VSD with Qp/QS 1.56  Plan/Discussion:  Shunt through the VSD is small to moderate without significant RA/RV volume overload or dysfunction. There  is mild PAH which may be due in part to sleep apnea. Would continue medical therapy. Plan sleep study. Repeat echo in 6-12 months. If R-side continues to dilate can repeat cath with eye toward VSD patch as needed.   Echo 10/27/17 Study Conclusions - Left ventricle: The cavity size was mildly dilated. Wall   thickness was normal. Systolic function was normal. The estimated   ejection fraction was in the range of 55% to 60%. Wall motion was   normal; there were no regional wall motion abnormalities. Left   ventricular diastolic function parameters were normal. - Left atrium: The atrium was mildly dilated.  Impressions: - Normal LV function; small perimembranous VSD with peak velocity   5.4 m/s; mild LVE; mild LAE.  MPI 12/30/11 Rest ECG: NSR - Normal EKG  Stress ECG: No significant change from baseline ECG  QPS Raw Data Images:  Normal; no motion artifact; normal heart/lung ratio. Stress Images:  There is decreased uptake in the anterior wall. Rest Images:  There is  decreased uptake in the anterior wall. Subtraction (SDS):  There is a fixed anteriour defect that is most consistent with breast attenuation.  Impression Exercise Capacity:  Lexiscan with no exercise. BP Response:  Normal blood pressure response. Clinical Symptoms:  No significant symptoms noted. ECG Impression:  No significant ST segment change suggestive of ischemia. Comparison with Prior Nuclear Study: No significant change from previous study  Overall Impression:  Low risk stress nuclear study. and When compared to prior study performed there has been no significant There is moderate breast attenuation artifact..  LV Wall Motion:  NL LV Function; NL Wall Motion  Echo 12/16/11 Study Conclusions  - Left ventricle: The cavity size was normal. Systolic function was normal. The estimated ejection fraction was in the range of 60% to 65%. Wall motion was normal; there were no regional wall motion abnormalities. LV apical false tendon. There is a small membranous VSD with left to right flow by color doppler. There is no right to left shunting noted by saline microbubble contrast. Doppler was not performed across the VSD. Doppler parameters are consistent with abnormal left ventricular relaxation (grade 1 diastolic dysfunction). The E/e' ratio is <10, suggesting normal LV filling pressure. - Atrial septum: No defect or patent foramen ovale was identified by color doppler or saline microbubble contrast study. - Tricuspid valve: Mild regurgitation. - Pulmonary arteries: PA peak pressure: 18mm Hg (S). - Inferior vena cava: The vessel was normal in size; the respirophasic diameter changes were in the normal range (= 50%); findings are consistent with normal central venous pressure.  EKG:  EKG is personally reviewed today.  The ekg ordered 10/31/18 demonstrates normal sinus rhythm  Recent Labs: No results found for requested labs within last 8760 hours.    Recent Lipid Panel    Component Value Date/Time   CHOL 217 (H) 11/23/2017 1226   TRIG 69 11/23/2017 1226   HDL 83 11/23/2017 1226   CHOLHDL 2.6 11/23/2017 1226   LDLCALC 120 (H) 11/23/2017 1226    Physical Exam:    VS:  BP 114/72   Pulse 84   Temp (!) 97.2 F (36.2 C)   Ht 5\' 10"  (1.778 m)   Wt 233 lb 12.8 oz (106.1 kg)   SpO2 96%   BMI 33.55 kg/m     Wt Readings from Last 3 Encounters:  04/12/19 233 lb 12.8 oz (106.1 kg)  10/31/18 244 lb (110.7 kg)  11/25/17 247 lb (112 kg)  GEN: Well nourished, well developed in no acute distress HEENT: Normal, moist mucous membranes NECK: No JVD CARDIAC: regular rhythm, normal S1 and S2, no rubs or gallops. Continuous 4/6 machinelike murmur, loudest at LLSB VASCULAR: Radial and DP pulses 2+ bilaterally. No carotid bruits RESPIRATORY:  Clear to auscultation without rales, wheezing or rhonchi  ABDOMEN: Soft, non-tender, non-distended MUSCULOSKELETAL:  Ambulates independently SKIN: Warm and dry, no edema NEUROLOGIC:  Alert and oriented x 3. No focal neuro deficits noted. PSYCHIATRIC:  Normal affect   ASSESSMENT:    1. Ventricular septal defect   2. Chest pain, unspecified type   3. Personal history of covid-19   4. Class 1 obesity due to excess calories without serious comorbidity with body mass index (BMI) of 33.0 to 33.9 in adult    PLAN:    Chest pain, tachycardia event: episode several months ago, worked up with monitor and echo. Has not recurred -low suspicion for ACS as self resolved -no CAD on prior cath -we did discuss today that ACS/heart attack can occur spontaneously. Reviewed recommendations for ER evaluation with ECG and troponins if this occurs.  History of Covid-19: slowly recovering. Still with mild fatigue, but breathing stable. -we discussed recommendations re: vaccination, even with prior infection. She plans to get vaccine.  VSD: cath showed Qp/Qs <2 in 2019. Stable on echo in 2020. No heart failure  symptoms. -continue to monitor  CV risk counseling and primary prevention: -recommend heart healthy/Mediterranean diet, with whole grains, fruits, vegetable, fish, lean meats, nuts, and olive oil. Limit salt. -recommend moderate walking, 3-5 times/week for 30-50 minutes each session. Aim for at least 150 minutes.week. Goal should be pace of 3 miles/hours, or walking 1.5 miles in 30 minutes -recommend avoidance of tobacco products. Avoid excess alcohol. -Additional risk factor control:  -Diabetes: A1c is 5.4 05/2016  -Lipids: checked in 2019  -Blood pressure control: at goal today, on no antihypertensives  -Weight: Class 1 obesity per BMI, she is working on weight loss. The 10-year ASCVD risk score Mikey Bussing DC Brooke Bonito., et al., 2013) is: 2.7%   Values used to calculate the score:     Age: 50 years     Sex: Female     Is Non-Hispanic African American: No     Diabetic: No     Tobacco smoker: No     Systolic Blood Pressure: 99991111 mmHg     Is BP treated: No     HDL Cholesterol: 83 mg/dL     Total Cholesterol: 217 mg/dL  Plan for follow up: 12 mos or sooner as needed  Medication Adjustments/Labs and Tests Ordered: Current medicines are reviewed at length with the patient today.  Concerns regarding medicines are outlined above.  No orders of the defined types were placed in this encounter.  No orders of the defined types were placed in this encounter.   Patient Instructions  Medication Instructions:  Your Physician recommend you continue on your current medication as directed.    *If you need a refill on your cardiac medications before your next appointment, please call your pharmacy*   Lab Work: None   Testing/Procedures: None   Follow-Up: At [facility], you and your health needs are our priority.  As part of our continuing mission to provide you with exceptional heart care, we have created designated Provider Care Teams.  These Care Teams include your primary Cardiologist  (physician) and Advanced Practice Providers (APPs -  Physician Assistants and Nurse Practitioners) who all work together to provide you  with the care you need, when you need it.  We recommend signing up for the patient portal called "MyChart".  Sign up information is provided on this After Visit Summary.  MyChart is used to connect with patients for Virtual Visits (Telemedicine).  Patients are able to view lab/test results, encounter notes, upcoming appointments, etc.  Non-urgent messages can be sent to your provider as well.   To learn more about what you can do with MyChart, go to [email or web address].    Your next appointment:   1 year(s)  The format for your next appointment:   In Person  Provider:   Buford Dresser, MD   Other Instructions If you have chest pain again, go to ER, and they can do an ECG and a troponin (we use high sensitivity in [place] and larger hospitals in the area, other hospitals use the traditional troponin assay) and tell you rather quickly whether this is your heart.    Signed, Buford Dresser, MD PhD 04/12/2019 12:31 PM    [place]

## 2019-04-13 ENCOUNTER — Ambulatory Visit: Payer: Commercial Managed Care - PPO | Attending: Internal Medicine

## 2019-04-13 DIAGNOSIS — Z23 Encounter for immunization: Secondary | ICD-10-CM

## 2019-04-13 NOTE — Progress Notes (Signed)
   Covid-19 Vaccination Clinic  Name:  NHIA MCNICHOL    MRN: [id number] DOB: [birth year]  04/13/2019  [name] was observed post Covid-19 immunization for 15 minutes without incident. She was provided with Vaccine Information Sheet and instruction to access the V-Safe system.   [name] was instructed to call 911 with any severe reactions post vaccine: Marland Kitchen Difficulty breathing  . Swelling of face and throat  . A fast heartbeat  . A bad rash all over body  . Dizziness and weakness   Immunizations Administered    Name Date Dose VIS Date Route   Pfizer COVID-19 Vaccine 04/13/2019  2:25 PM 0.3 mL 01/06/2019 Intramuscular   Manufacturer: [place]   Lot: [id number]   [place]: [id number]

## 2019-05-15 ENCOUNTER — Ambulatory Visit: Payer: Commercial Managed Care - PPO | Attending: Internal Medicine

## 2019-05-15 DIAGNOSIS — Z23 Encounter for immunization: Secondary | ICD-10-CM

## 2019-05-15 NOTE — Progress Notes (Signed)
   Covid-19 Vaccination Clinic  Name:  Rebecca Stephens    MRN: [id number] DOB: [birth year]  05/15/2019  [name] was observed post Covid-19 immunization for 15 minutes without incident. She was provided with Vaccine Information Sheet and instruction to access the V-Safe system.   [name] was instructed to call 911 with any severe reactions post vaccine: Marland Kitchen Difficulty breathing  . Swelling of face and throat  . A fast heartbeat  . A bad rash all over body  . Dizziness and weakness   Immunizations Administered    Name Date Dose VIS Date Route   Pfizer COVID-19 Vaccine 05/15/2019 10:53 AM 0.3 mL 03/22/2018 Intramuscular   Manufacturer: [place]   Lot: [id number]   [place]: [id number]

## 2019-05-15 NOTE — Progress Notes (Signed)
   Covid-19 Vaccination Clinic  Name:  DIARY LATNER    MRN: [id number] DOB: [birth year]  05/15/2019  Ms. Golembeski was observed post Covid-19 immunization for 15 minutes without incident. She was provided with Vaccine Information Sheet and instruction to access the V-Safe system.   Ms. Pietrzyk was instructed to call 911 with any severe reactions post vaccine: Marland Kitchen Difficulty breathing  . Swelling of face and throat  . A fast heartbeat  . A bad rash all over body  . Dizziness and weakness   Immunizations Administered    Name Date Dose VIS Date Route   Pfizer COVID-19 Vaccine 05/15/2019 10:53 AM 0.3 mL 03/22/2018 Intramuscular   Manufacturer: [place]   Lot: [id number]   [place]: [id number]

## 2020-04-24 ENCOUNTER — Telehealth: Payer: Self-pay | Admitting: Cardiology

## 2020-04-24 NOTE — Telephone Encounter (Signed)
3.30.22 LVM on cell phone to schedule 1 yr fu w/Dr. Harrell Gave. LP

## 2020-09-11 DIAGNOSIS — Z20822 Contact with and (suspected) exposure to covid-19: Secondary | ICD-10-CM | POA: Diagnosis not present

## 2020-09-13 DIAGNOSIS — R509 Fever, unspecified: Secondary | ICD-10-CM | POA: Diagnosis not present

## 2020-09-13 DIAGNOSIS — J029 Acute pharyngitis, unspecified: Secondary | ICD-10-CM | POA: Diagnosis not present

## 2020-09-13 DIAGNOSIS — R519 Headache, unspecified: Secondary | ICD-10-CM | POA: Diagnosis not present

## 2020-09-13 DIAGNOSIS — R6883 Chills (without fever): Secondary | ICD-10-CM | POA: Diagnosis not present

## 2020-09-13 DIAGNOSIS — U071 COVID-19: Secondary | ICD-10-CM | POA: Diagnosis not present

## 2020-09-13 DIAGNOSIS — J45909 Unspecified asthma, uncomplicated: Secondary | ICD-10-CM | POA: Diagnosis not present

## 2020-10-24 DIAGNOSIS — D0501 Lobular carcinoma in situ of right breast: Secondary | ICD-10-CM | POA: Diagnosis not present

## 2020-10-24 DIAGNOSIS — Z803 Family history of malignant neoplasm of breast: Secondary | ICD-10-CM | POA: Diagnosis not present

## 2020-10-24 DIAGNOSIS — N6489 Other specified disorders of breast: Secondary | ICD-10-CM | POA: Diagnosis not present

## 2020-10-24 DIAGNOSIS — Z9229 Personal history of other drug therapy: Secondary | ICD-10-CM | POA: Diagnosis not present

## 2021-01-31 DIAGNOSIS — J069 Acute upper respiratory infection, unspecified: Secondary | ICD-10-CM | POA: Diagnosis not present

## 2021-03-06 DIAGNOSIS — G629 Polyneuropathy, unspecified: Secondary | ICD-10-CM | POA: Diagnosis not present

## 2021-03-06 DIAGNOSIS — E559 Vitamin D deficiency, unspecified: Secondary | ICD-10-CM | POA: Diagnosis not present

## 2021-03-06 DIAGNOSIS — R413 Other amnesia: Secondary | ICD-10-CM | POA: Diagnosis not present

## 2021-03-06 DIAGNOSIS — G4700 Insomnia, unspecified: Secondary | ICD-10-CM | POA: Diagnosis not present

## 2021-03-06 DIAGNOSIS — E538 Deficiency of other specified B group vitamins: Secondary | ICD-10-CM | POA: Diagnosis not present

## 2021-03-06 DIAGNOSIS — R5383 Other fatigue: Secondary | ICD-10-CM | POA: Diagnosis not present

## 2021-04-11 NOTE — Progress Notes (Signed)
?Cardiology Office Note:   ? ?Date:  04/14/2021  ? ?ID:  Rebecca Stephens, DOB [birth year], MRN [id number] ? ?PCP:  Vernie Shanks, MD  ?Cardiologist:  Buford Dresser, MD PhD ? ?Referring MD: Vernie Shanks, MD  ? ?CC: follow up ? ?History of Present Illness:   ? ?Rebecca Stephens is a 66 y.o. female with a hx of asthma/bronchitis, prior breast cancer s/p tamoxifen, hypertension, membranous VSD who is seen in follow up for the evaluation and management of chest pain and VSD. She was seen for initial consult on 10/18/17.  ? ?Cardiac history: Known VSD. At initial visit, noted to have chest pain and fatigue for about 6 months. Chest pain is dull, occurs about twice per day for 20 mins each time. Seems to occur at night/lying down but can be random. Not related to exertion, no associated nausea/vomiting/diaphoresis/shortness of breath. Stress test in 2013 here, no significant abnormalities. Had [facility] 10/2017, VSD shunt Qp/Qs 1.56 (less than 2, which is recommendation for repair), no coronary disease. Had symptoms of racing/chest pain 10/2018, Echo without significant change, monitor unremarkable. ? ?Today: ?Overall, she is feeling good. At times, she has noticed a few more episodes of "flutters." Also she had 1-2 episodes of chest tightness that she believes is more attributable to anxiety. ? ?Sometimes she experiences a severe lightheadedness that feels "like a wave coming over you" and she needs to hold on to a nearby wall/object to steady herself. ? ?She reports going through a "phase" with constant, significant fatigue. Lately she is feeling better. ? ?Also, she endorses numbness in her bilateral LE especially if they are elevated or she is standing on her feet for too long. ? ?Additionally she reports some intermittent insomnia. This is improving with more exercise.  ? ?Since her last visit she has successfully lost about 12 lbs. For activity she walks her dog, and enjoys yoga. With more hills such as a  golf course, she is able to walk about a quarter mile before needing to stop.  ? ?She denies any shortness of breath. No headaches, syncope, orthopnea, or PND. ? ?Her mother had dementia. She is scheduled to see a specialist at [facility] regarding her memory. ? ? ?Past Medical History:  ?Diagnosis Date  ? Asthma   ? Bronchitis   ? Cancer [facility])   ? Breast  ? DCIS (ductal carcinoma in situ) of breast 2011  ? right breast  ? Heart disease   ? Hypertension   ? VSD (ventricular septal defect)   ? ? ?Past Surgical History:  ?Procedure Laterality Date  ? BREAST SURGERY    ? carcinoma removal  ? CARDIAC CATHETERIZATION    ? MOUTH SURGERY    ? bone removed from under tongue  ? RIGHT/LEFT HEART CATH AND CORONARY ANGIOGRAPHY N/A 11/25/2017  ? Procedure: RIGHT/LEFT HEART CATH AND CORONARY ANGIOGRAPHY;  Surgeon: Jolaine Artist, MD;  Location: [facility];  Service: Cardiovascular;  Laterality: N/A;  ? TOTAL HIP ARTHROPLASTY  2004  ? right hip  ? ? ?Current Medications: ?Current Outpatient Medications on File Prior to Visit  ?Medication Sig  ? escitalopram (LEXAPRO) 10 MG tablet Take 10 mg by mouth at bedtime.   ? Fluticasone-Salmeterol (ADVAIR) 250-50 MCG/DOSE AEPB   ? ?No current facility-administered medications on file prior to visit.  ?  Now taking nexium BID ? ?Allergies:   Patient has no known allergies.  ? ?Social History  ? ?Tobacco Use  ? Smoking status: Never  ?  Smokeless tobacco: Never  ?Vaping Use  ? Vaping Use: Never used  ?Substance Use Topics  ? Alcohol use: Yes  ?  Alcohol/week: 2.0 standard drinks  ?  Types: 2 Glasses of wine per week  ? Drug use: No  ? ? ?Family History: ?The patient's family history includes Breast cancer (age of onset: 60) in her maternal grandmother; Breast cancer (age of onset: 34) in her mother; Cancer in her maternal grandfather and paternal grandfather; Clotting disorder in her father; Stroke in her paternal grandfather and paternal grandmother. ? ?ROS:   ?Please see the history  of present illness.   ?(+) Palpitations ?(+) Chest tightness ?(+) Lightheadedness ?(+) Bilateral LE numbness ?Additional pertinent ROS otherwise negative except as noted. ? ?EKGs/Labs/Other Studies Reviewed:   ? ?The following studies were reviewed today: ? ?Monitor 12/20/18 ?~5.5 days of data recorded on [employer] monitor. Patient had a min HR of 50 bpm, max HR of 134 bpm, and avg HR of 70 bpm. Predominant underlying rhythm was Sinus Rhythm. No VT, SVT, atrial fibrillation, high degree block, or pauses noted. Isolated atrial and ventricular ectopy was rare (<1%). There was 1 triggered event, which was normal sinus rhythm. No significant arrhythmias detected. ? ?Echo 11/07/18 ?1. Left ventricular ejection fraction, by visual estimation, is 55 to  ?60%. The left ventricle has normal function. Mildly increased left  ?ventricular size. Left ventricular septal wall thickness was normal. There  ?is no left ventricular hypertrophy.  ? 2. The average left ventricular global longitudinal strain is -22.1 %.  ? 3. Small membranous ventricular septal defect with left to right shunting  ?and peak velocity 4.8cm/s..  ? 4. Global right ventricle has normal systolic function.The right  ?ventricular size is normal. No increase in right ventricular wall  ?thickness.  ? 5. Left atrial size was mildly dilated.  ? 6. Right atrial size was normal.  ? 7. The mitral valve is normal in structure. No evidence of mitral valve  ?regurgitation. No evidence of mitral stenosis.  ? 8. The tricuspid valve is normal in structure. Tricuspid valve  ?regurgitation was not visualized by color flow Doppler.  ? 9. The aortic valve is normal in structure. Aortic valve regurgitation  ?was not visualized by color flow Doppler. Structurally normal aortic  ?valve, with no evidence of sclerosis or stenosis.  ?10. The pulmonic valve was normal in structure. Pulmonic valve  ?regurgitation is trivial by color flow Doppler.  ?11. The inferior vena cava is normal  in size with greater than 50%  ?respiratory variability, suggesting right atrial pressure of 3 mmHg.  ?12. TR signal is inadequate for assessing pulmonary artery systolic  ?pressure.  ?13. No significant change from echo 1 year ago.  ? ?R/LHC 11/25/17 ?Findings: ?  ?Ao = 123/66 (90) ?LV = 118/15 ?RA = 7 ?RV = 46/9 ?PA = 41/16 (25) ?PCW = 11 ?Fick cardiac output/index = 6.23/2.73 ?PVR = 2.25 ?Ao sat = 95% ?RA sat = 71%, 65%, 68% ?RV sat = 77%, 82% ?PA sat = 80%, 76% ?Qp/Qs = 1.56 ?  ?  ?Assessment: ?  ?1. Normal coronaries ?2. LVEF 60-65% ?3. Very mild PAH ?4. Small to moderate left to right shunt through membranous VSD with Qp/QS 1.56 ?  ?Plan/Discussion: ?  ?Shunt through the VSD is small to moderate without significant RA/RV volume overload or dysfunction. There is mild PAH which may be due in part to sleep apnea. Would continue medical therapy. Plan sleep study. Repeat echo in 6-12 months. If  R-side continues to dilate can repeat cath with eye toward VSD patch as needed.  ? ?Echo 10/27/17 ?Study Conclusions  ?- Left ventricle: The cavity size was mildly dilated. Wall ?  thickness was normal. Systolic function was normal. The estimated ?  ejection fraction was in the range of 55% to 60%. Wall motion was ?  normal; there were no regional wall motion abnormalities. Left ?  ventricular diastolic function parameters were normal. ?- Left atrium: The atrium was mildly dilated. ?  ?Impressions:  ?- Normal LV function; small perimembranous VSD with peak velocity ?  5.4 m/s; mild LVE; mild LAE. ? ?MPI 12/30/11 ?Rest ECG: NSR - Normal EKG ?  ?Stress ECG: No significant change from baseline ECG ?  ?QPS ?Raw Data Images:  Normal; no motion artifact; normal heart/lung ratio. ?Stress Images:  There is decreased uptake in the anterior wall. ?Rest Images:  There is decreased uptake in the anterior wall. ?Subtraction (SDS):  There is a fixed anteriour defect that is most consistent with breast attenuation. ?  ?Impression ?Exercise  Capacity:  Lexiscan with no exercise. ?BP Response:  Normal blood pressure response. ?Clinical Symptoms:  No significant symptoms noted. ?ECG Impression:  No significant ST segment change suggestive of isc

## 2021-04-14 ENCOUNTER — Encounter (HOSPITAL_BASED_OUTPATIENT_CLINIC_OR_DEPARTMENT_OTHER): Payer: Self-pay | Admitting: Cardiology

## 2021-04-14 ENCOUNTER — Ambulatory Visit (INDEPENDENT_AMBULATORY_CARE_PROVIDER_SITE_OTHER): Payer: Self-pay | Admitting: Cardiology

## 2021-04-14 ENCOUNTER — Other Ambulatory Visit: Payer: Self-pay

## 2021-04-14 VITALS — BP 120/84 | HR 73 | Ht 70.0 in | Wt 244.1 lb

## 2021-04-14 DIAGNOSIS — Z7189 Other specified counseling: Secondary | ICD-10-CM

## 2021-04-14 DIAGNOSIS — E6609 Other obesity due to excess calories: Secondary | ICD-10-CM

## 2021-04-14 DIAGNOSIS — R002 Palpitations: Secondary | ICD-10-CM

## 2021-04-14 DIAGNOSIS — Q210 Ventricular septal defect: Secondary | ICD-10-CM

## 2021-04-14 DIAGNOSIS — Z6835 Body mass index (BMI) 35.0-35.9, adult: Secondary | ICD-10-CM

## 2021-04-14 NOTE — Patient Instructions (Signed)
Medication Instructions:  Your physician recommends that you continue on your current medications as directed. Please refer to the Current Medication list given to you today.   *If you need a refill on your cardiac medications before your next appointment, please call your pharmacy*  Lab Work: NONE  Testing/Procedures: NONE  Follow-Up: At [facility], you and your health needs are our priority.  As part of our continuing mission to provide you with exceptional heart care, we have created designated Provider Care Teams.  These Care Teams include your primary Cardiologist (physician) and Advanced Practice Providers (APPs -  Physician Assistants and Nurse Practitioners) who all work together to provide you with the care you need, when you need it.  We recommend signing up for the patient portal called "MyChart".  Sign up information is provided on this After Visit Summary.  MyChart is used to connect with patients for Virtual Visits (Telemedicine).  Patients are able to view lab/test results, encounter notes, upcoming appointments, etc.  Non-urgent messages can be sent to your provider as well.   To learn more about what you can do with MyChart, go to [email or web address].    Your next appointment:   12 month(s)  The format for your next appointment:   In Person  Provider:   Bridgette Christopher, MD     

## 2021-05-06 DIAGNOSIS — R5383 Other fatigue: Secondary | ICD-10-CM | POA: Diagnosis not present

## 2021-05-06 DIAGNOSIS — Z03818 Encounter for observation for suspected exposure to other biological agents ruled out: Secondary | ICD-10-CM | POA: Diagnosis not present

## 2021-05-06 DIAGNOSIS — J208 Acute bronchitis due to other specified organisms: Secondary | ICD-10-CM | POA: Diagnosis not present

## 2021-05-06 DIAGNOSIS — R051 Acute cough: Secondary | ICD-10-CM | POA: Diagnosis not present

## 2021-05-06 DIAGNOSIS — J069 Acute upper respiratory infection, unspecified: Secondary | ICD-10-CM | POA: Diagnosis not present

## 2021-05-06 DIAGNOSIS — R519 Headache, unspecified: Secondary | ICD-10-CM | POA: Diagnosis not present

## 2021-05-12 DIAGNOSIS — R059 Cough, unspecified: Secondary | ICD-10-CM | POA: Diagnosis not present

## 2021-05-12 DIAGNOSIS — J45909 Unspecified asthma, uncomplicated: Secondary | ICD-10-CM | POA: Diagnosis not present

## 2021-05-13 ENCOUNTER — Ambulatory Visit: Payer: BC Managed Care – PPO | Admitting: Psychiatry

## 2021-05-16 ENCOUNTER — Telehealth: Payer: Self-pay | Admitting: Cardiology

## 2021-05-16 DIAGNOSIS — J40 Bronchitis, not specified as acute or chronic: Secondary | ICD-10-CM

## 2021-05-16 NOTE — Telephone Encounter (Signed)
Pt states that she has had Bronchitis and would like to know if Dr. Harrell Gave has a Pulmonologist she can be referred to. Please advise ?

## 2021-05-16 NOTE — Telephone Encounter (Signed)
Referral placed for patient per Dr. Harrell Gave ?

## 2021-05-22 NOTE — Progress Notes (Signed)
Patient no-showed for  appt.   No charge. ?

## 2021-07-24 DIAGNOSIS — G4700 Insomnia, unspecified: Secondary | ICD-10-CM | POA: Diagnosis not present

## 2021-07-24 DIAGNOSIS — R0683 Snoring: Secondary | ICD-10-CM | POA: Diagnosis not present

## 2021-07-24 DIAGNOSIS — F418 Other specified anxiety disorders: Secondary | ICD-10-CM | POA: Diagnosis not present

## 2021-07-24 DIAGNOSIS — F419 Anxiety disorder, unspecified: Secondary | ICD-10-CM | POA: Diagnosis not present

## 2021-07-24 DIAGNOSIS — E569 Vitamin deficiency, unspecified: Secondary | ICD-10-CM | POA: Diagnosis not present

## 2021-07-24 DIAGNOSIS — E785 Hyperlipidemia, unspecified: Secondary | ICD-10-CM | POA: Diagnosis not present

## 2021-07-24 DIAGNOSIS — R292 Abnormal reflex: Secondary | ICD-10-CM | POA: Diagnosis not present

## 2021-07-24 DIAGNOSIS — G6289 Other specified polyneuropathies: Secondary | ICD-10-CM | POA: Diagnosis not present

## 2021-07-24 DIAGNOSIS — G479 Sleep disorder, unspecified: Secondary | ICD-10-CM | POA: Diagnosis not present

## 2021-07-24 DIAGNOSIS — R4189 Other symptoms and signs involving cognitive functions and awareness: Secondary | ICD-10-CM | POA: Diagnosis not present

## 2021-07-24 DIAGNOSIS — Z79899 Other long term (current) drug therapy: Secondary | ICD-10-CM | POA: Diagnosis not present

## 2021-07-24 DIAGNOSIS — I10 Essential (primary) hypertension: Secondary | ICD-10-CM | POA: Diagnosis not present

## 2021-08-12 ENCOUNTER — Encounter (HOSPITAL_BASED_OUTPATIENT_CLINIC_OR_DEPARTMENT_OTHER): Payer: Self-pay

## 2021-08-19 ENCOUNTER — Encounter: Payer: Self-pay | Admitting: Pulmonary Disease

## 2021-08-19 ENCOUNTER — Ambulatory Visit: Payer: BC Managed Care – PPO | Admitting: Pulmonary Disease

## 2021-08-19 VITALS — BP 118/62 | HR 70 | Temp 98.6°F | Ht 70.0 in | Wt 235.4 lb

## 2021-08-19 DIAGNOSIS — J4540 Moderate persistent asthma, uncomplicated: Secondary | ICD-10-CM | POA: Diagnosis not present

## 2021-08-19 MED ORDER — TRELEGY ELLIPTA 200-62.5-25 MCG/ACT IN AEPB: 1.0000 | INHALATION_SPRAY | Freq: Every day | RESPIRATORY_TRACT | 5 refills | Status: DC

## 2021-08-19 MED ORDER — TRELEGY ELLIPTA 200-62.5-25 MCG/ACT IN AEPB: 1.0000 | INHALATION_SPRAY | Freq: Every day | RESPIRATORY_TRACT | 0 refills | Status: DC

## 2021-08-19 MED ORDER — OMEPRAZOLE 20 MG PO CPDR: 20.0000 mg | DELAYED_RELEASE_CAPSULE | Freq: Every day | ORAL | 11 refills | Status: DC

## 2021-08-19 NOTE — Progress Notes (Signed)
PCCM attending:  This is a 66 year old female, Past medical history of asthma, breast cancer, heart disease, hypertension, VSD repair.  Patient is a lifelong non-smoker.  Has had recurrent symptoms of bronchitis.  Currently for asthma is managed with Advair and albuterol.  She has not had any recent chest imaging.  Last chest x-ray was in 2011.  BP 118/62 (BP Location: Right Arm, Patient Position: Sitting, Cuff Size: Normal)   Pulse 70   Temp 98.6 F (37 C) (Oral)   Ht '5\' 10"'$  (1.778 m)   Wt 235 lb 6.4 oz (106.8 kg)   SpO2 92%   BMI 33.78 kg/m   General: Obese female comfortable HEENT: NCAT, tracking appropriately Lungs: Clear to auscultation bilaterally Heart: Regular rhythm S1-S2 Abdomen: Obese soft  Labs: Reviewed Chest x-ray: No recent chest imaging.  Assessment:  Recurrent bronchitis symptoms History of asthma on ICS/LABA, as needed albuterol GERD, not controlled Snores, PSG pending from [facility]  Plan: Recommended continued concern over weight loss, exercise Step up therapy with asthma symptoms, Trelegy 200+ samples and new prescription Better reflux control, PPI, prescription for omeprazole versus purchase over-the-counter. Ensure PSG complete and treat underlying OSA if she has it. PFTs prior to next office visit  RTC 6 months   Agree with documentation from Farrel Gordon, DO PGY 2.  I spent 62 minutes dedicated to the care of this patient on the date of this encounter to include pre-visit review of records, face-to-face time with the patient discussing conditions above, post visit ordering of testing, clinical documentation with the electronic health record, making appropriate referrals as documented, and communicating necessary findings to members of the patients care team.    Garner Nash, [facility] 08/19/2021 8:22 AM

## 2021-08-19 NOTE — Patient Instructions (Addendum)
Thank you for visiting Dr. Valeta Harms at [facility]. Today we recommend the following:  Orders Placed This Encounter  Procedures   Pulmonary Function Test   Meds ordered this encounter  Medications   omeprazole (PRILOSEC) 20 MG capsule    Sig: Take 1 capsule (20 mg total) by mouth daily.    Dispense:  30 capsule    Refill:  11   Trelegy samples   Return in about 6 months (around 02/19/2022) for with APP or Dr. Valeta Harms.    Please do your part to reduce the spread of COVID-19.

## 2021-08-19 NOTE — Progress Notes (Signed)
Synopsis: Referred in April 2023 for bronchitis by Buford Dresser,*  Subjective:   PATIENT ID: Rebecca Stephens GENDER: female DOB: September 02, 1955, MRN: [id number]  Chief Complaint  Patient presents with   Consult    Pt is here for consult. Pt has chest xray in April that showed bronchitis. Pt states just this year she has had bronchitis 2 times. Pt states she has a minor cough right now that is non productive. Pt is on Advair daily and albuterol as needed. Pt states the inhalers do help with her symptoms.     Rebecca Repress. Stephens is a 66 y.o. F with PMH of VSD, asthma on Advair, breast cancer who was referred to our clinic for bronchitis.   She explains that she has had bronchitis twice in the last year, in December and most recently in April. Most recently the onset of illness was abrupt and she ultimately required antibiotic therapy which helped her symptoms. She feels that she should have went to the hospital for her illness but chose not to ultimately. She has had prednisone in the past during illness but does not feel she got much relief.   She has had COVID three times most recently in early 2022. She reports asthma triggers of heat, environmental allergens, dust, and smoke. She also explains that her home had remodeling done August 2022-November 2022 where brick was cut out and she does not think the air duct system was fully protected as she still finds dust particles around the house now. She had her first bronchitis flare after her home was remodeled. She reports a long amount of time between symptom onset and feeling back to normal with each flare.   Regarding her asthma, she feels like she is not limited in daily activities but does note that she is not in good cardiovascular shape which causes her to become tired easily. Her asthma was diagnosed in her early 22's. She uses albuterol daily to stay ahead of possible symptoms and Advair daily. Her symptoms are controlled and she has  no nighttime awakening or recent known exacerbations due to her asthma. She does snore some nights and has acid reflux.    Past Medical History:  Diagnosis Date   Asthma    Bronchitis    Cancer ([place])    Breast   DCIS (ductal carcinoma in situ) of breast 2011   right breast   Heart disease    Hypertension    VSD (ventricular septal defect)      Family History  Problem Relation Age of Onset   Clotting disorder Father    Breast cancer Mother 16   Breast cancer Maternal Grandmother 86   Cancer Maternal Grandfather        LEG TUMOR   Cancer Paternal Grandfather        COLON   Stroke Paternal Grandfather    Stroke Paternal Grandmother      Past Surgical History:  Procedure Laterality Date   BREAST SURGERY     carcinoma removal   CARDIAC CATHETERIZATION     MOUTH SURGERY     bone removed from under tongue   RIGHT/LEFT HEART CATH AND CORONARY ANGIOGRAPHY N/A 11/25/2017   Procedure: RIGHT/LEFT HEART CATH AND CORONARY ANGIOGRAPHY;  Surgeon: Jolaine Artist, MD;  Location: [facility];  Service: Cardiovascular;  Laterality: N/A;   TOTAL HIP ARTHROPLASTY  2004   right hip    Social History   Socioeconomic History   Marital status: Married  Spouse name: Not on file   Number of children: Not on file   Years of education: Not on file   Highest education level: Not on file  Occupational History   Not on file  Tobacco Use   Smoking status: Never   Smokeless tobacco: Never  Vaping Use   Vaping Use: Never used  Substance and Sexual Activity   Alcohol use: Yes    Alcohol/week: 2.0 standard drinks of alcohol    Types: 2 Glasses of wine per week   Drug use: No   Sexual activity: Yes    Birth control/protection: Post-menopausal    Comment: 1st intercourse 66 yo-Fewer than 5 partners  Other Topics Concern   Not on file  Social History Narrative   Not on file   Social Determinants of Health   Financial Resource Strain: Not on file  Food Insecurity: Not on  file  Transportation Needs: Not on file  Physical Activity: Not on file  Stress: Not on file  Social Connections: Not on file  Intimate Partner Violence: Not on file     No Known Allergies   Outpatient Medications Prior to Visit  Medication Sig Dispense Refill   albuterol (VENTOLIN HFA) 108 (90 Base) MCG/ACT inhaler SMARTSIG:2 Puff(s) By Mouth Every 6 Hours PRN     escitalopram (LEXAPRO) 10 MG tablet Take 10 mg by mouth at bedtime.      Fluticasone-Salmeterol (ADVAIR) 250-50 MCG/DOSE AEPB      gabapentin (NEURONTIN) 300 MG capsule Take 300 mg by mouth at bedtime.     No facility-administered medications prior to visit.    Review of Systems  Constitutional:  Negative for weight loss.  Respiratory:  Negative for cough, shortness of breath and wheezing.   Cardiovascular:  Negative for chest pain, leg swelling and PND.  Skin:  Negative for rash.  Neurological:  Positive for tingling. Negative for focal weakness.  Endo/Heme/Allergies:  Positive for environmental allergies.     Objective:  Constitutional:Well-appearing female, in no acute distress. Cardio:Regular rate and rhythm. Loud, holosystolic murmur (known VSD). Pulm:Clear to auscultation bilaterally. Normal work of breathing on room air. IDP:OEUMPNTI for extremity edema.  Skin:Warm and dry.  Neuro:Awake, alert, no focal deficit noted. Psych:Pleasant mood and affect.   Vitals:   08/19/21 1003  BP: 118/62  Pulse: 70  Temp: 98.6 F (37 C)  TempSrc: Oral  SpO2: 92%  Weight: 235 lb 6.4 oz (106.8 kg)  Height: '5\' 10"'$  (1.778 m)   92% on  RA BMI Readings from Last 3 Encounters:  08/19/21 33.78 kg/m  04/14/21 35.02 kg/m  04/12/19 33.55 kg/m   Wt Readings from Last 3 Encounters:  08/19/21 235 lb 6.4 oz (106.8 kg)  04/14/21 244 lb 1.6 oz (110.7 kg)  04/12/19 233 lb 12.8 oz (106.1 kg)     CBC    Component Value Date/Time   WBC 6.2 11/23/2017 1226   WBC 6.0 07/07/2016 1244   RBC 4.45 11/23/2017 1226   RBC  4.44 07/07/2016 1244   HGB 12.8 11/23/2017 1226   HCT 37.9 11/23/2017 1226   PLT 261 11/23/2017 1226   MCV 85 11/23/2017 1226   MCH 28.8 11/23/2017 1226   MCH 29.1 07/07/2016 1244   MCHC 33.8 11/23/2017 1226   MCHC 32.3 07/07/2016 1244   RDW 13.3 11/23/2017 1226   LYMPHSABS 1,740 07/07/2016 1244   MONOABS 420 07/07/2016 1244   EOSABS 180 07/07/2016 1244   BASOSABS 60 07/07/2016 1244    Pulmonary Functions Testing Results:  No data to display             Assessment & Plan:     ICD-10-CM   1. Moderate persistent asthma without complication  A26.33 Pulmonary Function Test      Discussion: Patient presents today with multiple episodes of bronchitis on known diagnosis of asthma. She has several risk factors for asthma flares including environmental allergies--moreso in the fall, but also heat and hot weather, snoring/likely OSA, acid reflux, and obesity. No formal PFTs, no diagnosis during childhood. Current regimen of Advair and albuterol has been her regimen for several years now. Her recurrent bronchitis is likely multifactorial due to her multiple risk factors and medication tolerance. Options including a trial of a new medication (Trelegy) combined with risk factor modification versus in depth laboratory assessment discussed with patient, and decision to adjust medication and risk factors was made in shared decision making.  -PFTs -Trial of Trelegy -Omeprazole for GERD, sleep study to assess for OSA, increase exercise as tolerated for weight modification -RTC in 6 months or sooner if further problems arise   Current Outpatient Medications:    albuterol (VENTOLIN HFA) 108 (90 Base) MCG/ACT inhaler, SMARTSIG:2 Puff(s) By Mouth Every 6 Hours PRN, Disp: , Rfl:    escitalopram (LEXAPRO) 10 MG tablet, Take 10 mg by mouth at bedtime. , Disp: , Rfl:    Fluticasone-Salmeterol (ADVAIR) 250-50 MCG/DOSE AEPB, , Disp: , Rfl:    Fluticasone-Umeclidin-Vilant (TRELEGY ELLIPTA)  200-62.5-25 MCG/ACT AEPB, Inhale 1 puff into the lungs daily., Disp: 1 each, Rfl: 5   gabapentin (NEURONTIN) 300 MG capsule, Take 300 mg by mouth at bedtime., Disp: , Rfl:    omeprazole (PRILOSEC) 20 MG capsule, Take 1 capsule (20 mg total) by mouth daily., Disp: 30 capsule, Rfl: 11  I spent 30 minutes dedicated to the care of this patient on the date of this encounter to include pre-visit review of records, face-to-face time with the patient discussing conditions above, post visit ordering of testing, clinical documentation with the electronic health record, making appropriate referrals as documented, and communicating necessary findings to members of the patients care team.   Farrel Gordon, DO [facility] 08/19/2021 11:17 AM

## 2021-08-19 NOTE — Telephone Encounter (Signed)
Please advise 

## 2021-08-21 ENCOUNTER — Ambulatory Visit (INDEPENDENT_AMBULATORY_CARE_PROVIDER_SITE_OTHER): Payer: BC Managed Care – PPO | Admitting: Pulmonary Disease

## 2021-08-21 DIAGNOSIS — J4540 Moderate persistent asthma, uncomplicated: Secondary | ICD-10-CM

## 2021-08-21 LAB — PULMONARY FUNCTION TEST
DL/VA % pred: 98 %
DL/VA: 3.94 ml/min/mmHg/L
DLCO cor % pred: 99 %
DLCO cor: 24.17 ml/min/mmHg
DLCO unc % pred: 102 %
DLCO unc: 24.81 ml/min/mmHg
FEF 25-75 Post: 2.38 L/sec
FEF 25-75 Pre: 1.8 L/sec
FEF2575-%Change-Post: 32 %
FEF2575-%Pred-Post: 94 %
FEF2575-%Pred-Pre: 70 %
FEV1-%Change-Post: 9 %
FEV1-%Pred-Post: 83 %
FEV1-%Pred-Pre: 76 %
FEV1-Post: 2.55 L
FEV1-Pre: 2.34 L
FEV1FVC-%Change-Post: 4 %
FEV1FVC-%Pred-Pre: 96 %
FEV6-%Change-Post: 4 %
FEV6-%Pred-Post: 85 %
FEV6-%Pred-Pre: 82 %
FEV6-Post: 3.26 L
FEV6-Pre: 3.13 L
FEV6FVC-%Change-Post: 0 %
FEV6FVC-%Pred-Post: 103 %
FEV6FVC-%Pred-Pre: 103 %
FVC-%Change-Post: 4 %
FVC-%Pred-Post: 83 %
FVC-%Pred-Pre: 79 %
FVC-Post: 3.29 L
FVC-Pre: 3.15 L
Post FEV1/FVC ratio: 78 %
Post FEV6/FVC ratio: 100 %
Pre FEV1/FVC ratio: 74 %
Pre FEV6/FVC Ratio: 99 %
RV % pred: 149 %
RV: 3.54 L
TLC % pred: 116 %
TLC: 6.96 L

## 2021-08-21 NOTE — Patient Instructions (Signed)
Full PFT Performed Today  

## 2021-08-21 NOTE — Progress Notes (Signed)
Full PFT Performed Today  

## 2021-10-06 DIAGNOSIS — Z6834 Body mass index (BMI) 34.0-34.9, adult: Secondary | ICD-10-CM | POA: Diagnosis not present

## 2021-10-06 DIAGNOSIS — Z01419 Encounter for gynecological examination (general) (routine) without abnormal findings: Secondary | ICD-10-CM | POA: Diagnosis not present

## 2021-10-06 DIAGNOSIS — N390 Urinary tract infection, site not specified: Secondary | ICD-10-CM | POA: Diagnosis not present

## 2021-10-06 DIAGNOSIS — Z124 Encounter for screening for malignant neoplasm of cervix: Secondary | ICD-10-CM | POA: Diagnosis not present

## 2021-10-08 DIAGNOSIS — G4700 Insomnia, unspecified: Secondary | ICD-10-CM | POA: Diagnosis not present

## 2021-10-09 DIAGNOSIS — L570 Actinic keratosis: Secondary | ICD-10-CM | POA: Diagnosis not present

## 2021-10-09 DIAGNOSIS — Z85828 Personal history of other malignant neoplasm of skin: Secondary | ICD-10-CM | POA: Diagnosis not present

## 2021-10-09 DIAGNOSIS — L218 Other seborrheic dermatitis: Secondary | ICD-10-CM | POA: Diagnosis not present

## 2021-10-09 DIAGNOSIS — L821 Other seborrheic keratosis: Secondary | ICD-10-CM | POA: Diagnosis not present

## 2021-10-13 DIAGNOSIS — Z853 Personal history of malignant neoplasm of breast: Secondary | ICD-10-CM | POA: Diagnosis not present

## 2021-10-13 DIAGNOSIS — Z803 Family history of malignant neoplasm of breast: Secondary | ICD-10-CM | POA: Diagnosis not present

## 2021-10-13 DIAGNOSIS — Z800 Family history of malignant neoplasm of digestive organs: Secondary | ICD-10-CM | POA: Diagnosis not present

## 2021-10-13 DIAGNOSIS — Z86010 Personal history of colonic polyps: Secondary | ICD-10-CM | POA: Diagnosis not present

## 2021-10-17 DIAGNOSIS — M47812 Spondylosis without myelopathy or radiculopathy, cervical region: Secondary | ICD-10-CM | POA: Diagnosis not present

## 2021-10-17 DIAGNOSIS — G629 Polyneuropathy, unspecified: Secondary | ICD-10-CM | POA: Diagnosis not present

## 2021-10-17 DIAGNOSIS — R4189 Other symptoms and signs involving cognitive functions and awareness: Secondary | ICD-10-CM | POA: Diagnosis not present

## 2021-10-17 DIAGNOSIS — M4802 Spinal stenosis, cervical region: Secondary | ICD-10-CM | POA: Diagnosis not present

## 2022-06-05 ENCOUNTER — Encounter: Payer: Self-pay | Admitting: Pulmonary Disease

## 2022-06-05 ENCOUNTER — Ambulatory Visit (INDEPENDENT_AMBULATORY_CARE_PROVIDER_SITE_OTHER): Payer: Medicare Other | Admitting: Pulmonary Disease

## 2022-06-05 VITALS — BP 114/70 | HR 72 | Temp 97.8°F | Ht 70.0 in | Wt 240.2 lb

## 2022-06-05 DIAGNOSIS — J4540 Moderate persistent asthma, uncomplicated: Secondary | ICD-10-CM | POA: Diagnosis not present

## 2022-06-05 DIAGNOSIS — R062 Wheezing: Secondary | ICD-10-CM

## 2022-06-05 MED ORDER — PREDNISONE 10 MG PO TABS: ORAL_TABLET | ORAL | 0 refills | Status: DC

## 2022-06-05 MED ORDER — MONTELUKAST SODIUM 10 MG PO TABS
10.0000 mg | ORAL_TABLET | Freq: Every day | ORAL | 11 refills | Status: DC
Start: 2022-06-05 — End: 2022-08-11

## 2022-06-05 MED ORDER — FLUTICASONE PROPIONATE 50 MCG/ACT NA SUSP
2.0000 | Freq: Every day | NASAL | 2 refills | Status: DC
Start: 2022-06-05 — End: 2022-08-11

## 2022-06-05 NOTE — Progress Notes (Signed)
Synopsis: Referred in April 2023 for bronchitis by No ref. provider found  Subjective:   PATIENT ID: Rebecca Stephens GENDER: female DOB: 08/01/1955, MRN: [id number]  Chief Complaint  Patient presents with   Follow-up    Cough, and wheezing. Pt has been using her Inhalers but states she is still wheezing. Just finished doxycyline and prednisone.      Rebecca Montane. Stephens is a 67 y.o. F with PMH of VSD, asthma on Advair, breast cancer who was referred to our clinic for bronchitis.   She explains that she has had bronchitis twice in the last year, in December and most recently in April. Most recently the onset of illness was abrupt and she ultimately required antibiotic therapy which helped her symptoms. She feels that she should have went to the hospital for her illness but chose not to ultimately. She has had prednisone in the past during illness but does not feel she got much relief.   She has had COVID three times most recently in early 2022. She reports asthma triggers of heat, environmental allergens, dust, and smoke. She also explains that her home had remodeling done August 2022-November 2022 where brick was cut out and she does not think the air duct system was fully protected as she still finds dust particles around the house now. She had her first bronchitis flare after her home was remodeled. She reports a long amount of time between symptom onset and feeling back to normal with each flare.   Regarding her asthma, she feels like she is not limited in daily activities but does note that she is not in good cardiovascular shape which causes her to become tired easily. Her asthma was diagnosed in her early 108's. She uses albuterol daily to stay ahead of possible symptoms and Advair daily. Her symptoms are controlled and she has no nighttime awakening or recent known exacerbations due to her asthma. She does snore some nights and has acid reflux.   OV 06/05/2022: about 6 weeks had uri  symptoms. Now with increase sob, cough. Still with on going symptoms several weeks.  From respiratory standpoint still not doing great.  Still feeling flared up chest tightness and wheezing.   Past Medical History:  Diagnosis Date   Asthma    Bronchitis    Cancer (HCC)    Breast   DCIS (ductal carcinoma in situ) of breast 2011   right breast   Heart disease    Hypertension    VSD (ventricular septal defect)      Family History  Problem Relation Age of Onset   Clotting disorder Father    Breast cancer Mother 70   Breast cancer Maternal Grandmother 59   Cancer Maternal Grandfather        LEG TUMOR   Cancer Paternal Grandfather        COLON   Stroke Paternal Grandfather    Stroke Paternal Grandmother      Past Surgical History:  Procedure Laterality Date   BREAST SURGERY     carcinoma removal   CARDIAC CATHETERIZATION     MOUTH SURGERY     bone removed from under tongue   RIGHT/LEFT HEART CATH AND CORONARY ANGIOGRAPHY N/A 11/25/2017   Procedure: RIGHT/LEFT HEART CATH AND CORONARY ANGIOGRAPHY;  Surgeon: Dolores Patty, MD;  Location: [facility];  Service: Cardiovascular;  Laterality: N/A;   TOTAL HIP ARTHROPLASTY  2004   right hip    Social History   Socioeconomic History  Marital status: Married    Spouse name: Not on file   Number of children: Not on file   Years of education: Not on file   Highest education level: Not on file  Occupational History   Not on file  Tobacco Use   Smoking status: Never   Smokeless tobacco: Never  Vaping Use   Vaping Use: Never used  Substance and Sexual Activity   Alcohol use: Yes    Alcohol/week: 2.0 standard drinks of alcohol    Types: 2 Glasses of wine per week   Drug use: No   Sexual activity: Yes    Birth control/protection: Post-menopausal    Comment: 1st intercourse 67 yo-Fewer than 5 partners  Other Topics Concern   Not on file  Social History Narrative   Not on file   Social Determinants of Health    Financial Resource Strain: Not on file  Food Insecurity: Not on file  Transportation Needs: Not on file  Physical Activity: Not on file  Stress: Not on file  Social Connections: Not on file  Intimate Partner Violence: Not on file     No Known Allergies   Outpatient Medications Prior to Visit  Medication Sig Dispense Refill   albuterol (VENTOLIN HFA) 108 (90 Base) MCG/ACT inhaler SMARTSIG:2 Puff(s) By Mouth Every 6 Hours PRN     escitalopram (LEXAPRO) 10 MG tablet Take 10 mg by mouth at bedtime.      Fluticasone-Salmeterol (ADVAIR) 250-50 MCG/DOSE AEPB      Fluticasone-Umeclidin-Vilant (TRELEGY ELLIPTA) 200-62.5-25 MCG/ACT AEPB Inhale 1 puff into the lungs daily. 1 each 5   Fluticasone-Umeclidin-Vilant (TRELEGY ELLIPTA) 200-62.5-25 MCG/ACT AEPB Inhale 1 puff into the lungs daily. 2 each 0   gabapentin (NEURONTIN) 300 MG capsule Take 300 mg by mouth at bedtime.     omeprazole (PRILOSEC) 20 MG capsule Take 1 capsule (20 mg total) by mouth daily. 30 capsule 11   No facility-administered medications prior to visit.    Review of Systems  Constitutional:  Negative for chills, fever, malaise/fatigue and weight loss.  HENT:  Negative for hearing loss, sore throat and tinnitus.   Eyes:  Negative for blurred vision and double vision.  Respiratory:  Positive for cough and shortness of breath. Negative for hemoptysis, sputum production, wheezing and stridor.   Cardiovascular:  Negative for chest pain, palpitations, orthopnea, leg swelling and PND.  Gastrointestinal:  Negative for abdominal pain, constipation, diarrhea, heartburn, nausea and vomiting.  Genitourinary:  Negative for dysuria, hematuria and urgency.  Musculoskeletal:  Negative for joint pain and myalgias.  Skin:  Negative for itching and rash.  Neurological:  Negative for dizziness, tingling, weakness and headaches.  Endo/Heme/Allergies:  Negative for environmental allergies. Does not bruise/bleed easily.   Psychiatric/Behavioral:  Negative for depression. The patient is not nervous/anxious and does not have insomnia.   All other systems reviewed and are negative.    Objective:   Physical Exam Constitutional:      Appearance: She is obese.  HENT:     Head: Normocephalic and atraumatic.     Nose: No rhinorrhea.  Eyes:     Pupils: Pupils are equal, round, and reactive to light.  Cardiovascular:     Rate and Rhythm: Normal rate and regular rhythm.     Heart sounds: No murmur heard.    No friction rub. No gallop.  Pulmonary:     Effort: No respiratory distress.     Breath sounds: No stridor. Wheezing present. No rhonchi or rales.  Chest:  Chest wall: No tenderness.  Abdominal:     General: Bowel sounds are normal. There is no distension.     Tenderness: There is no abdominal tenderness.  Musculoskeletal:     Right lower leg: No edema.     Left lower leg: No edema.  Skin:    General: Skin is warm.  Neurological:     General: No focal deficit present.     Motor: No weakness.  Psychiatric:        Mood and Affect: Mood normal.        Behavior: Behavior normal.       Vitals:   06/05/22 1129  BP: 114/70  Pulse: 72  Temp: 97.8 F (36.6 C)  TempSrc: Oral  SpO2: 95%  Weight: 240 lb 3.2 oz (109 kg)  Height: 5\' 10"  (1.778 m)   95% on  RA BMI Readings from Last 3 Encounters:  06/05/22 34.47 kg/m  08/19/21 33.78 kg/m  04/14/21 35.02 kg/m   Wt Readings from Last 3 Encounters:  06/05/22 240 lb 3.2 oz (109 kg)  08/19/21 235 lb 6.4 oz (106.8 kg)  04/14/21 244 lb 1.6 oz (110.7 kg)     CBC    Component Value Date/Time   WBC 6.2 11/23/2017 1226   WBC 6.0 07/07/2016 1244   RBC 4.45 11/23/2017 1226   RBC 4.44 07/07/2016 1244   HGB 12.8 11/23/2017 1226   HCT 37.9 11/23/2017 1226   PLT 261 11/23/2017 1226   MCV 85 11/23/2017 1226   MCH 28.8 11/23/2017 1226   MCH 29.1 07/07/2016 1244   MCHC 33.8 11/23/2017 1226   MCHC 32.3 07/07/2016 1244   RDW 13.3  11/23/2017 1226   LYMPHSABS 1,740 07/07/2016 1244   MONOABS 420 07/07/2016 1244   EOSABS 180 07/07/2016 1244   BASOSABS 60 07/07/2016 1244    Pulmonary Functions Testing Results:    Latest Ref Rng & Units 08/21/2021   11:09 AM  PFT Results  FVC-Pre L 3.15   FVC-Predicted Pre % 79   FVC-Post L 3.29   FVC-Predicted Post % 83   Pre FEV1/FVC % % 74   Post FEV1/FCV % % 78   FEV1-Pre L 2.34   FEV1-Predicted Pre % 76   FEV1-Post L 2.55   DLCO uncorrected ml/min/mmHg 24.81   DLCO UNC% % 102   DLCO corrected ml/min/mmHg 24.17   DLCO COR %Predicted % 99   DLVA Predicted % 98   TLC L 6.96   TLC % Predicted % 116   RV % Predicted % 149        Assessment & Plan:     ICD-10-CM   1. Wheezing  R06.2 REGIONAL PANEL 2    IgE    IgE    REGIONAL PANEL 2    2. Moderate persistent asthma without complication  J45.40 REGIONAL PANEL 2    IgE    IgE    REGIONAL PANEL 2       Discussion:  This is a 67 year old female, multiple episodes of recurrent bronchitis known history of asthma.  She has OSA reflux and obesity.  She been on Advair in the past.  We switched her to Trelegy.  She has recurrent exacerbation of symptoms requiring steroids again recently.  Plan: We will start her on Flonase, Singulair, antihistamine daily, vitamin D supplement Continue Trelegy daily Start prednisone taper again. Check IgE and regional allergy panel. I see no point checking absolute eosinophil counts as she is recently completed steroids and are  likely falsely low. Will see how she does in the next couple of weeks if she is not any better and based on her RAST panel and IgE could consider biologic in the future.  RTC in 6 to 8 weeks   Current Outpatient Medications:    albuterol (VENTOLIN HFA) 108 (90 Base) MCG/ACT inhaler, SMARTSIG:2 Puff(s) By Mouth Every 6 Hours PRN, Disp: , Rfl:    escitalopram (LEXAPRO) 10 MG tablet, Take 10 mg by mouth at bedtime. , Disp: , Rfl:    fluticasone (FLONASE) 50  MCG/ACT nasal spray, Place 2 sprays into both nostrils daily., Disp: 16 g, Rfl: 2   Fluticasone-Salmeterol (ADVAIR) 250-50 MCG/DOSE AEPB, , Disp: , Rfl:    Fluticasone-Umeclidin-Vilant (TRELEGY ELLIPTA) 200-62.5-25 MCG/ACT AEPB, Inhale 1 puff into the lungs daily., Disp: 1 each, Rfl: 5   Fluticasone-Umeclidin-Vilant (TRELEGY ELLIPTA) 200-62.5-25 MCG/ACT AEPB, Inhale 1 puff into the lungs daily., Disp: 2 each, Rfl: 0   gabapentin (NEURONTIN) 300 MG capsule, Take 300 mg by mouth at bedtime., Disp: , Rfl:    montelukast (SINGULAIR) 10 MG tablet, Take 1 tablet (10 mg total) by mouth at bedtime., Disp: 30 tablet, Rfl: 11   omeprazole (PRILOSEC) 20 MG capsule, Take 1 capsule (20 mg total) by mouth daily., Disp: 30 capsule, Rfl: 11   predniSONE (DELTASONE) 10 MG tablet, Take 4 tabs by mouth once daily x4 days, then 3 tabs x4 days, 2 tabs x4 days, 1 tab x4 days and stop., Disp: 40 tablet, Rfl: 0   Josephine Igo, DO [facility] 06/05/2022 5:12 PM

## 2022-06-05 NOTE — Patient Instructions (Signed)
Thank you for visiting Dr. [name] at [facility]. Today we recommend the following:  Orders Placed This Encounter  Procedures   REGIONAL PANEL 2   IgE   Vitamin d supplement - 2000IU per day   Meds ordered this encounter  Medications   predniSONE (DELTASONE) 10 MG tablet    Sig: Take 4 tabs by mouth once daily x4 days, then 3 tabs x4 days, 2 tabs x4 days, 1 tab x4 days and stop.    Dispense:  40 tablet    Refill:  0   montelukast (SINGULAIR) 10 MG tablet    Sig: Take 1 tablet (10 mg total) by mouth at bedtime.    Dispense:  30 tablet    Refill:  11   fluticasone (FLONASE) 50 MCG/ACT nasal spray    Sig: Place 2 sprays into both nostrils daily.    Dispense:  16 g    Refill:  2   Samples of Breztri   Return in about 8 weeks (around 07/31/2022).    Please do your part to reduce the spread of COVID-19.

## 2022-06-08 LAB — IGE: IgE (Immunoglobulin E), Serum: 392 kU/L — ABNORMAL HIGH (ref <=114)

## 2022-06-11 LAB — REGIONAL PANEL 2
012-IgE Goldenrod: 0.15 kU/L — AB
Amer Sycamore IgE Qn: <0.1 kU/L
Bahia Grass IgE: <0.1 kU/L
Bermuda Grass IgE: <0.1 kU/L
Cocklebur IgE: 0.14 kU/L — AB
Cottonwood IgE: <0.1 kU/L
Elm, American IgE: <0.1 kU/L
Hickory, White IgE: <0.1 kU/L
Johnson Grass IgE: <0.1 kU/L
Kentucky Bluegrass IgE: <0.1 kU/L
Lamb's Quarters IgE: <0.1 kU/L
Maple/Box Elder IgE: <0.1 kU/L
Oak, White IgE: <0.1 kU/L
Pigweed, Rough IgE: <0.1 kU/L
Plantain, English IgE: <0.1 kU/L
Ragweed, Short IgE: 0.39 kU/L — AB
T005-IgE Beech (American): <0.1 kU/L
T010-IgE Walnut: <0.1 kU/L
T012-IgE Willow: <0.1 kU/L
T015-IgE Ash, White: <0.1 kU/L
W004-IgE Ragweed, False: <0.1 kU/L

## 2022-06-17 NOTE — Progress Notes (Signed)
Rebecca Stephens,   Rebecca Stephens are allergic to a few items on your regional allergy panel and your IgE is elevated consistent with your asthma history.   Thanks,  BLI  Josephine Igo, DO [facility] 06/17/2022 3:49 PM

## 2022-08-10 NOTE — Progress Notes (Unsigned)
Synopsis: Referred in April 2023 for bronchitis by No ref. provider found  Subjective:   PATIENT ID: Rebecca Stephens GENDER: female DOB: 1955/04/07, MRN: [id number]  No chief complaint on file.   Rebecca Stephens is a 67 y.o. F with PMH of VSD, asthma on Advair, breast cancer who was referred to our clinic for bronchitis.   She explains that she has had bronchitis twice in the last year, in December and most recently in April. Most recently the onset of illness was abrupt and she ultimately required antibiotic therapy which helped her symptoms. She feels that she should have went to the hospital for her illness but chose not to ultimately. She has had prednisone in the past during illness but does not feel she got much relief.   She has had COVID three times most recently in early 2022. She reports asthma triggers of heat, environmental allergens, dust, and smoke. She also explains that her home had remodeling done August 2022-November 2022 where brick was cut out and she does not think the air duct system was fully protected as she still finds dust particles around the house now. She had her first bronchitis flare after her home was remodeled. She reports a long amount of time between symptom onset and feeling back to normal with each flare.   Regarding her asthma, she feels like she is not limited in daily activities but does note that she is not in good cardiovascular shape which causes her to become tired easily. Her asthma was diagnosed in her early 48's. She uses albuterol daily to stay ahead of possible symptoms and Advair daily. Her symptoms are controlled and she has no nighttime awakening or recent known exacerbations due to her asthma. She does snore some nights and has acid reflux.   OV 06/05/2022: about 6 weeks had uri symptoms. Now with increase sob, cough. Still with on going symptoms several weeks.  From respiratory standpoint still not doing great.  Still feeling flared up  chest tightness and wheezing.  OV 08/10/2022: Here today for follow-up regarding recurrent asthma exacerbations.  Last office visit she was started on Flonase Singulair daily antihistamine vitamin D supplement continuation of her triple therapy inhaler regimen of Trelegy.  She was started again on a prednisone taper.  Absolute eosinophil count was not checked as she was recently on steroids prior to that.Patient had a IgE level checked that demonstrated elevation at 392.  Her RAST panel was positive for cocklebur goldenrod and ragweed.   Past Medical History:  Diagnosis Date   Asthma    Bronchitis    Cancer (HCC)    Breast   DCIS (ductal carcinoma in situ) of breast 2011   right breast   Heart disease    Hypertension    VSD (ventricular septal defect)      Family History  Problem Relation Age of Onset   Clotting disorder Father    Breast cancer Mother 41   Breast cancer Maternal Grandmother 49   Cancer Maternal Grandfather        LEG TUMOR   Cancer Paternal Grandfather        COLON   Stroke Paternal Grandfather    Stroke Paternal Grandmother      Past Surgical History:  Procedure Laterality Date   BREAST SURGERY     carcinoma removal   CARDIAC CATHETERIZATION     MOUTH SURGERY     bone removed from under tongue   RIGHT/LEFT HEART CATH AND CORONARY  ANGIOGRAPHY N/A 11/25/2017   Procedure: RIGHT/LEFT HEART CATH AND CORONARY ANGIOGRAPHY;  Surgeon: Dolores Patty, MD;  Location: [facility];  Service: Cardiovascular;  Laterality: N/A;   TOTAL HIP ARTHROPLASTY  2004   right hip    Social History   Socioeconomic History   Marital status: Married    Spouse name: Not on file   Number of children: Not on file   Years of education: Not on file   Highest education level: Not on file  Occupational History   Not on file  Tobacco Use   Smoking status: Never   Smokeless tobacco: Never  Vaping Use   Vaping status: Never Used  Substance and Sexual Activity    Alcohol use: Yes    Alcohol/week: 2.0 standard drinks of alcohol    Types: 2 Glasses of wine per week   Drug use: No   Sexual activity: Yes    Birth control/protection: Post-menopausal    Comment: 1st intercourse 67 yo-Fewer than 5 partners  Other Topics Concern   Not on file  Social History Narrative   Not on file   Social Determinants of Health   Financial Resource Strain: Not on file  Food Insecurity: Not on file  Transportation Needs: Not on file  Physical Activity: Not on file  Stress: Not on file  Social Connections: Not on file  Intimate Partner Violence: Not on file     No Known Allergies   Outpatient Medications Prior to Visit  Medication Sig Dispense Refill   albuterol (VENTOLIN HFA) 108 (90 Base) MCG/ACT inhaler SMARTSIG:2 Puff(s) By Mouth Every 6 Hours PRN     escitalopram (LEXAPRO) 10 MG tablet Take 10 mg by mouth at bedtime.      fluticasone (FLONASE) 50 MCG/ACT nasal spray Place 2 sprays into both nostrils daily. 16 g 2   Fluticasone-Salmeterol (ADVAIR) 250-50 MCG/DOSE AEPB      Fluticasone-Umeclidin-Vilant (TRELEGY ELLIPTA) 200-62.5-25 MCG/ACT AEPB Inhale 1 puff into the lungs daily. 1 each 5   Fluticasone-Umeclidin-Vilant (TRELEGY ELLIPTA) 200-62.5-25 MCG/ACT AEPB Inhale 1 puff into the lungs daily. 2 each 0   gabapentin (NEURONTIN) 300 MG capsule Take 300 mg by mouth at bedtime.     montelukast (SINGULAIR) 10 MG tablet Take 1 tablet (10 mg total) by mouth at bedtime. 30 tablet 11   omeprazole (PRILOSEC) 20 MG capsule Take 1 capsule (20 mg total) by mouth daily. 30 capsule 11   predniSONE (DELTASONE) 10 MG tablet Take 4 tabs by mouth once daily x4 days, then 3 tabs x4 days, 2 tabs x4 days, 1 tab x4 days and stop. 40 tablet 0   No facility-administered medications prior to visit.    ROS   Objective:   Physical Exam    There were no vitals filed for this visit.    on  RA BMI Readings from Last 3 Encounters:  06/05/22 34.47 kg/m  08/19/21 33.78  kg/m  04/14/21 35.02 kg/m   Wt Readings from Last 3 Encounters:  06/05/22 240 lb 3.2 oz (109 kg)  08/19/21 235 lb 6.4 oz (106.8 kg)  04/14/21 244 lb 1.6 oz (110.7 kg)     CBC    Component Value Date/Time   WBC 6.2 11/23/2017 1226   WBC 6.0 07/07/2016 1244   RBC 4.45 11/23/2017 1226   RBC 4.44 07/07/2016 1244   HGB 12.8 11/23/2017 1226   HCT 37.9 11/23/2017 1226   PLT 261 11/23/2017 1226   MCV 85 11/23/2017 1226   MCH  28.8 11/23/2017 1226   MCH 29.1 07/07/2016 1244   MCHC 33.8 11/23/2017 1226   MCHC 32.3 07/07/2016 1244   RDW 13.3 11/23/2017 1226   LYMPHSABS 1,740 07/07/2016 1244   MONOABS 420 07/07/2016 1244   EOSABS 180 07/07/2016 1244   BASOSABS 60 07/07/2016 1244    Pulmonary Functions Testing Results:    Latest Ref Rng & Units 08/21/2021   11:09 AM  PFT Results  FVC-Pre L 3.15   FVC-Predicted Pre % 79   FVC-Post L 3.29   FVC-Predicted Post % 83   Pre FEV1/FVC % % 74   Post FEV1/FCV % % 78   FEV1-Pre L 2.34   FEV1-Predicted Pre % 76   FEV1-Post L 2.55   DLCO uncorrected ml/min/mmHg 24.81   DLCO UNC% % 102   DLCO corrected ml/min/mmHg 24.17   DLCO COR %Predicted % 99   DLVA Predicted % 98   TLC L 6.96   TLC % Predicted % 116   RV % Predicted % 149        Assessment & Plan:   No diagnosis found.    Discussion:  ***  This is a 67 year old female, multiple episodes of recurrent bronchitis known history of asthma.  She has OSA reflux and obesity.  She been on Advair in the past.  We switched her to Trelegy.  She has recurrent exacerbation of symptoms requiring steroids again recently.  Plan: We will start her on Flonase, Singulair, antihistamine daily, vitamin D supplement Continue Trelegy daily Start prednisone taper again. Check IgE and regional allergy panel. I see no point checking absolute eosinophil counts as she is recently completed steroids and are likely falsely low. Will see how she does in the next couple of weeks if she is not any  better and based on her RAST panel and IgE could consider biologic in the future.  RTC in 6 to 8 weeks   Current Outpatient Medications:    albuterol (VENTOLIN HFA) 108 (90 Base) MCG/ACT inhaler, SMARTSIG:2 Puff(s) By Mouth Every 6 Hours PRN, Disp: , Rfl:    escitalopram (LEXAPRO) 10 MG tablet, Take 10 mg by mouth at bedtime. , Disp: , Rfl:    fluticasone (FLONASE) 50 MCG/ACT nasal spray, Place 2 sprays into both nostrils daily., Disp: 16 g, Rfl: 2   Fluticasone-Salmeterol (ADVAIR) 250-50 MCG/DOSE AEPB, , Disp: , Rfl:    Fluticasone-Umeclidin-Vilant (TRELEGY ELLIPTA) 200-62.5-25 MCG/ACT AEPB, Inhale 1 puff into the lungs daily., Disp: 1 each, Rfl: 5   Fluticasone-Umeclidin-Vilant (TRELEGY ELLIPTA) 200-62.5-25 MCG/ACT AEPB, Inhale 1 puff into the lungs daily., Disp: 2 each, Rfl: 0   gabapentin (NEURONTIN) 300 MG capsule, Take 300 mg by mouth at bedtime., Disp: , Rfl:    montelukast (SINGULAIR) 10 MG tablet, Take 1 tablet (10 mg total) by mouth at bedtime., Disp: 30 tablet, Rfl: 11   omeprazole (PRILOSEC) 20 MG capsule, Take 1 capsule (20 mg total) by mouth daily., Disp: 30 capsule, Rfl: 11   predniSONE (DELTASONE) 10 MG tablet, Take 4 tabs by mouth once daily x4 days, then 3 tabs x4 days, 2 tabs x4 days, 1 tab x4 days and stop., Disp: 40 tablet, Rfl: 0   Josephine Igo, DO [facility] 08/10/2022 5:51 PM

## 2022-08-11 ENCOUNTER — Encounter: Payer: Self-pay | Admitting: Pulmonary Disease

## 2022-08-11 ENCOUNTER — Ambulatory Visit: Payer: Medicare Other | Admitting: Pulmonary Disease

## 2022-08-11 VITALS — BP 110/70 | HR 73 | Ht 70.0 in | Wt 237.0 lb

## 2022-08-11 DIAGNOSIS — J4540 Moderate persistent asthma, uncomplicated: Secondary | ICD-10-CM

## 2022-08-11 DIAGNOSIS — R062 Wheezing: Secondary | ICD-10-CM

## 2022-08-11 MED ORDER — MONTELUKAST SODIUM 10 MG PO TABS: 10.0000 mg | ORAL_TABLET | Freq: Every day | ORAL | 11 refills | Status: DC

## 2022-08-11 MED ORDER — FLUTICASONE PROPIONATE 50 MCG/ACT NA SUSP: 2.0000 | Freq: Every day | NASAL | 11 refills | Status: DC

## 2022-08-11 NOTE — Patient Instructions (Signed)
Thank you for visiting Dr. [name] at [facility]. Today we recommend the following:  Return in about 1 year (around 08/11/2023) for with APP or Dr. [name].    Please do your part to reduce the spread of COVID-19.

## 2022-08-21 ENCOUNTER — Telehealth: Payer: Self-pay | Admitting: Pulmonary Disease

## 2022-08-21 MED ORDER — BREZTRI AEROSPHERE 160-9-4.8 MCG/ACT IN AERO: 2.0000 | INHALATION_SPRAY | Freq: Every day | RESPIRATORY_TRACT | 11 refills | Status: DC

## 2022-08-21 NOTE — Telephone Encounter (Signed)
Refill for breztri sent to pharmacy. Nothing further needed.

## 2022-08-21 NOTE — Telephone Encounter (Signed)
PT would like [place] to call in [place] for her.   Pharm: [facility] at [address] she said. Dr. Jamal Maes while she was at her last appt but it did not go thru.

## 2022-12-22 ENCOUNTER — Other Ambulatory Visit: Payer: Self-pay | Admitting: Pulmonary Disease

## 2023-03-12 ENCOUNTER — Telehealth: Payer: Medicare Other | Admitting: Family Medicine

## 2023-03-12 DIAGNOSIS — J069 Acute upper respiratory infection, unspecified: Secondary | ICD-10-CM

## 2023-03-12 MED ORDER — AZITHROMYCIN 250 MG PO TABS: ORAL_TABLET | ORAL | 0 refills | Status: AC

## 2023-03-12 MED ORDER — PREDNISONE 10 MG PO TABS: ORAL_TABLET | ORAL | 0 refills | Status: DC

## 2023-03-12 MED ORDER — BENZONATATE 200 MG PO CAPS: 200.0000 mg | ORAL_CAPSULE | Freq: Two times a day (BID) | ORAL | 0 refills | Status: DC | PRN

## 2023-03-12 NOTE — Progress Notes (Signed)
E-Visit for Cough   We are sorry that you are not feeling well.  Here is how we plan to help!  Based on your presentation I believe you most likely have A cough due to bacteria.  When patients have a fever and a productive cough with a change in color or increased sputum production, we are concerned about bacterial bronchitis.  If left untreated it can progress to pneumonia.  If your symptoms do not improve with your treatment plan it is important that you contact your provider.   I have prescribed Azithromyin 250 mg: two tablets now and then one tablet daily for 4 additonal days    In addition you may use A prescription cough medication called Tessalon Perles 100mg . You may take 1-2 capsules every 8 hours as needed for your cough.  Prednisone also sent  From your responses in the eVisit questionnaire you describe inflammation in the upper respiratory tract which is causing a significant cough.  This is commonly called Bronchitis and has four common causes:   Allergies Viral Infections Acid Reflux Bacterial Infection Allergies, viruses and acid reflux are treated by controlling symptoms or eliminating the cause. An example might be a cough caused by taking certain blood pressure medications. You stop the cough by changing the medication. Another example might be a cough caused by acid reflux. Controlling the reflux helps control the cough.  USE OF BRONCHODILATOR ("RESCUE") INHALERS: There is a risk from using your bronchodilator too frequently.  The risk is that over-reliance on a medication which only relaxes the muscles surrounding the breathing tubes can reduce the effectiveness of medications prescribed to reduce swelling and congestion of the tubes themselves.  Although you feel brief relief from the bronchodilator inhaler, your asthma may actually be worsening with the tubes becoming more swollen and filled with mucus.  This can delay other crucial treatments, such as oral steroid  medications. If you need to use a bronchodilator inhaler daily, several times per day, you should discuss this with your provider.  There are probably better treatments that could be used to keep your asthma under control.     HOME CARE Only take medications as instructed by your medical team. Complete the entire course of an antibiotic. Drink plenty of fluids and get plenty of rest. Avoid close contacts especially the very young and the elderly Cover your mouth if you cough or cough into your sleeve. Always remember to wash your hands A steam or ultrasonic humidifier can help congestion.   GET HELP RIGHT AWAY IF: You develop worsening fever. You become short of breath You cough up blood. Your symptoms persist after you have completed your treatment plan MAKE SURE YOU  Understand these instructions. Will watch your condition. Will get help right away if you are not doing well or get worse.    Thank you for choosing an e-visit.  Your e-visit answers were reviewed by a board certified advanced clinical practitioner to complete your personal care plan. Depending upon the condition, your plan could have included both over the counter or prescription medications.  Please review your pharmacy choice. Make sure the pharmacy is open so you can pick up prescription now. If there is a problem, you may contact your provider through [employer] and have the prescription routed to another pharmacy.  Your safety is important to [place]. If you have drug allergies check your prescription carefully.   For the next 24 hours you can use MyChart to ask questions about today's  visit, request a non-urgent call back, or ask for a work or school excuse. You will get an email in the next two days asking about your experience. I hope that your e-visit has been valuable and will speed your recovery.    have provided 5 minutes of non face to face time during this encounter for chart review and documentation.

## 2023-03-29 ENCOUNTER — Ambulatory Visit (INDEPENDENT_AMBULATORY_CARE_PROVIDER_SITE_OTHER)

## 2023-03-29 ENCOUNTER — Ambulatory Visit: Payer: Medicare Other | Admitting: Nurse Practitioner

## 2023-03-29 ENCOUNTER — Encounter: Payer: Self-pay | Admitting: Nurse Practitioner

## 2023-03-29 VITALS — BP 126/74 | HR 75 | Temp 97.6°F | Ht 70.0 in | Wt 237.0 lb

## 2023-03-29 DIAGNOSIS — J4551 Severe persistent asthma with (acute) exacerbation: Secondary | ICD-10-CM

## 2023-03-29 DIAGNOSIS — J45901 Unspecified asthma with (acute) exacerbation: Secondary | ICD-10-CM | POA: Diagnosis not present

## 2023-03-29 DIAGNOSIS — J4541 Moderate persistent asthma with (acute) exacerbation: Secondary | ICD-10-CM | POA: Diagnosis not present

## 2023-03-29 DIAGNOSIS — R058 Other specified cough: Secondary | ICD-10-CM | POA: Diagnosis not present

## 2023-03-29 DIAGNOSIS — Z96641 Presence of right artificial hip joint: Secondary | ICD-10-CM | POA: Diagnosis not present

## 2023-03-29 DIAGNOSIS — J309 Allergic rhinitis, unspecified: Secondary | ICD-10-CM | POA: Diagnosis not present

## 2023-03-29 DIAGNOSIS — M25552 Pain in left hip: Secondary | ICD-10-CM | POA: Diagnosis not present

## 2023-03-29 DIAGNOSIS — J069 Acute upper respiratory infection, unspecified: Secondary | ICD-10-CM | POA: Diagnosis not present

## 2023-03-29 DIAGNOSIS — R918 Other nonspecific abnormal finding of lung field: Secondary | ICD-10-CM | POA: Diagnosis not present

## 2023-03-29 LAB — NITRIC OXIDE: Nitric Oxide: 10

## 2023-03-29 MED ORDER — FLUTICASONE PROPIONATE 50 MCG/ACT NA SUSP: 2.0000 | Freq: Every day | NASAL | 11 refills | Status: AC

## 2023-03-29 MED ORDER — ALBUTEROL SULFATE (2.5 MG/3ML) 0.083% IN NEBU
2.5000 mg | INHALATION_SOLUTION | Freq: Once | RESPIRATORY_TRACT | Status: AC
  Administered 2023-03-29: 2.5 mg via RESPIRATORY_TRACT

## 2023-03-29 MED ORDER — METHYLPREDNISOLONE ACETATE 80 MG/ML IJ SUSP
80.0000 mg | Freq: Once | INTRAMUSCULAR | Status: AC
  Administered 2023-03-29: 80 mg via INTRAMUSCULAR

## 2023-03-29 MED ORDER — HYDROCODONE BIT-HOMATROP MBR 5-1.5 MG/5ML PO SOLN: 5.0000 mL | Freq: Four times a day (QID) | ORAL | 0 refills | Status: DC | PRN

## 2023-03-29 MED ORDER — ALBUTEROL SULFATE (2.5 MG/3ML) 0.083% IN NEBU: 2.5000 mg | INHALATION_SOLUTION | Freq: Four times a day (QID) | RESPIRATORY_TRACT | 5 refills | Status: AC | PRN

## 2023-03-29 MED ORDER — ALBUTEROL SULFATE HFA 108 (90 BASE) MCG/ACT IN AERS: 2.0000 | INHALATION_SPRAY | Freq: Four times a day (QID) | RESPIRATORY_TRACT | 2 refills | Status: DC | PRN

## 2023-03-29 MED ORDER — PREDNISONE 10 MG PO TABS: ORAL_TABLET | ORAL | 0 refills | Status: DC

## 2023-03-29 NOTE — Progress Notes (Addendum)
 @Patient  ID: Rebecca Stephens, female    DOB: March 11, 1955, 68 y.o.   MRN: [id number]  Chief Complaint  Patient presents with   Acute Visit    Chest heaviness and "shallow breathing" over the past wk. She has had some night sweats. She has a prod cough with clear, "pasty" sputum.  She has had some sinus pressure/congestion.     Referring provider: No ref. provider found  HPI: 68 year old female, never smoker followed for asthma. She is a former patient of [name] and last seen in office 08/11/2022.   TEST/EVENTS:  08/21/2021 PFT: FVC 79, FEV1 76, ratio 78, TLC 116, DLCOcor 99. Midflow reversibility.   08/11/2022: OV with Dr. [name].  Recurrent asthma exacerbations.  Last visit she was started on Flonase, Singulair, daily antihistamine.  On Trelegy inhaler.  Again started on a prednisone taper.  Absolute eosinophil count was not checked as she was recently on steroids prior to that.  Did have an IgE level elevated at 392.  RAST panel positive for cocklebur, goldenrod and ragweed.  Feeling better today.  Feels like from a respiratory standpoint she is doing great.  Did switch from Trelegy to [place].  If she has recurrent exacerbations would consider biologic in future.  03/29/3023: Today - acute Patient presents today for acute visit.  Granddaugther had the flu beginning of February. She developed flu like illness around 2/9. She started feeling worse around 2/14 and had a telehealth visit. She was prescribed z pack and prednisone taper. She was slowly feeling better up until this week. Her husband came home with cold symptoms about a week ago and then she started having a lot of sinus symptoms. She's now having more chest pressure and chest discomfort with coughing/deep breathing. She was having night sweats but didn't have any last night. Cough is productive with clear, pasty phlegm. She feels more short winded than usual. Has noticed wheezing. Finish the prednisone 2 days ago; felt better on  it. Denies fevers, chills, hemoptysis, leg swelling, facial tenderness, headaches. She COVID/flu swabbed at home which was negative. She's using her Breztri, singulair, and flonase. Appetite reduced. Drinking plenty of fluids.   No Known Allergies  Immunization History  Administered Date(s) Administered   Influenza Inj Mdck Quad Pf 09/21/2018   PFIZER(Purple Top)SARS-COV-2 Vaccination 04/13/2019, 05/15/2019    Past Medical History:  Diagnosis Date   Asthma    Bronchitis    Cancer (HCC)    Breast   DCIS (ductal carcinoma in situ) of breast 2011   right breast   Heart disease    Hypertension    VSD (ventricular septal defect)     Tobacco History: Social History   Tobacco Use  Smoking Status Never  Smokeless Tobacco Never   Counseling given: Not Answered   Outpatient Medications Prior to Visit  Medication Sig Dispense Refill   benzonatate (TESSALON) 200 MG capsule Take 1 capsule (200 mg total) by mouth 2 (two) times daily as needed for cough. 20 capsule 0   Budeson-Glycopyrrol-Formoterol (BREZTRI AEROSPHERE) 160-9-4.8 MCG/ACT AERO Inhale 2 puffs into the lungs daily. 10.7 g 11   escitalopram (LEXAPRO) 10 MG tablet Take 10 mg by mouth at bedtime.      gabapentin (NEURONTIN) 300 MG capsule Take 300 mg by mouth at bedtime.     omeprazole (PRILOSEC) 20 MG capsule TAKE 1 CAPSULE BY MOUTH DAILY. 30 capsule 10   albuterol (VENTOLIN HFA) 108 (90 Base) MCG/ACT inhaler SMARTSIG:2 Puff(s) By Mouth Every 6  Hours PRN     fluticasone (FLONASE) 50 MCG/ACT nasal spray Place 2 sprays into both nostrils daily. 16 g 11   Fluticasone-Salmeterol (ADVAIR) 250-50 MCG/DOSE AEPB      montelukast (SINGULAIR) 10 MG tablet Take 1 tablet (10 mg total) by mouth at bedtime. 30 tablet 11   predniSONE (DELTASONE) 10 MG tablet Take 4 tabs by mouth once daily x4 days, then 3 tabs x4 days, 2 tabs x4 days, 1 tab x4 days and stop. 40 tablet 0   No facility-administered medications prior to visit.     Review  of Systems:   Constitutional: No weight loss or gain, fevers, chills +fatigue, lassitude, night sweats  HEENT: No headaches, difficulty swallowing, tooth/dental problems, or sore throat. No sneezing, itching, ear ache +nasal congestion/drainage, post nasal drip CV:  No chest pain, orthopnea, PND, swelling in lower extremities, anasarca, dizziness, palpitations, syncope Resp: + shortness of breath with exertion; productive cough; wheeze. No excess mucus or change in color of mucus. No hemoptysis. No chest wall deformity GI:  No heartburn, indigestion, abdominal pain, nausea, vomiting, diarrhea, change in bowel habits, loss of appetite, bloody stools.  GU: No dysuria, change in color of urine, urgency or frequency.   Skin: No rash, lesions, ulcerations MSK:  No joint pain or swelling.  Neuro: No dizziness or lightheadedness.  Psych: No depression or anxiety. Mood stable.     Physical Exam:  BP 126/74   Pulse 75   Temp 97.6 F (36.4 C) (Oral)   Ht 5\' 10"  (1.778 m)   Wt 237 lb (107.5 kg)   SpO2 93%   BMI 34.01 kg/m   GEN: Pleasant, interactive, acutely-ill appearing; in no acute distress HEENT:  Normocephalic and atraumatic. EACs patent bilaterally. TM pearly gray with present light reflex bilaterally. PERRLA. Sclera white. Nasal turbinates erythematous, moist and patent bilaterally. Clear rhinorrhea present. Oropharynx erythematous and moist, without exudate or edema. No lesions, ulcerations NECK:  Supple w/ fair ROM. No JVD present. Normal carotid impulses w/o bruits. Thyroid symmetrical with no goiter or nodules palpated. Submandibular lymphadenopathy.   CV: RRR, systolic murmur, no peripheral edema. Pulses intact, +2 bilaterally. No cyanosis, pallor or clubbing. PULMONARY:  Unlabored, regular breathing. Scattered wheezes bilaterally A&P. Bronchitic cough. No accessory muscle use.  GI: BS present and normoactive. Soft, non-tender to palpation. No organomegaly or masses detected. MSK:  No erythema, warmth or tenderness. Cap refil <2 sec all extrem. No deformities or joint swelling noted.  Neuro: A/Ox3. No focal deficits noted.   Skin: Warm, no lesions or rashe Psych: Normal affect and behavior. Judgement and thought content appropriate.     Lab Results:  CBC    Component Value Date/Time   WBC 6.2 11/23/2017 1226   WBC 6.0 07/07/2016 1244   RBC 4.45 11/23/2017 1226   RBC 4.44 07/07/2016 1244   HGB 12.8 11/23/2017 1226   HCT 37.9 11/23/2017 1226   PLT 261 11/23/2017 1226   MCV 85 11/23/2017 1226   MCH 28.8 11/23/2017 1226   MCH 29.1 07/07/2016 1244   MCHC 33.8 11/23/2017 1226   MCHC 32.3 07/07/2016 1244   RDW 13.3 11/23/2017 1226   LYMPHSABS 1,740 07/07/2016 1244   MONOABS 420 07/07/2016 1244   EOSABS 180 07/07/2016 1244   BASOSABS 60 07/07/2016 1244    BMET    Component Value Date/Time   NA 141 11/23/2017 1226   K 4.7 11/23/2017 1226   CL 102 11/23/2017 1226   CO2 22 11/23/2017 1226   GLUCOSE  91 11/23/2017 1226   GLUCOSE 92 11/13/2009 1209   BUN 14 11/23/2017 1226   CREATININE 0.77 11/23/2017 1226   CALCIUM 9.3 11/23/2017 1226   GFRNONAA 84 11/23/2017 1226   GFRAA 97 11/23/2017 1226    BNP No results found for: "BNP"   Imaging:  DG Chest 2 View Result Date: 03/29/2023 CLINICAL DATA:  Productive cough.  Asthma exacerbation. EXAM: CHEST - 2 VIEW COMPARISON:  X-ray 05/06/2021. FINDINGS: There is some linear opacity lung bases likely scar or atelectasis. No pleural effusion. No consolidation. Normal cardiopericardial silhouette. Degenerative changes seen along the spine. IMPRESSION: Hyperinflation.  Basilar scar or atelectasis. Electronically Signed   By: Karen Kays M.D.   On: 03/29/2023 18:02    albuterol (PROVENTIL) (2.5 MG/3ML) 0.083% nebulizer solution 2.5 mg     Date Action Dose Route User   03/29/2023 1632 Given 2.5 mg Nebulization Christen Butter, CMA      methylPREDNISolone acetate (DEPO-MEDROL) injection 80 mg     Date Action  Dose Route User   03/29/2023 1631 Given 80 mg Intramuscular (Right Upper Outer Quadrant) Christen Butter, CMA          Latest Ref Rng & Units 08/21/2021   11:09 AM  PFT Results  FVC-Pre L 3.15   FVC-Predicted Pre % 79   FVC-Post L 3.29   FVC-Predicted Post % 83   Pre FEV1/FVC % % 74   Post FEV1/FCV % % 78   FEV1-Pre L 2.34   FEV1-Predicted Pre % 76   FEV1-Post L 2.55   DLCO uncorrected ml/min/mmHg 24.81   DLCO UNC% % 102   DLCO corrected ml/min/mmHg 24.17   DLCO COR %Predicted % 99   DLVA Predicted % 98   TLC L 6.96   TLC % Predicted % 116   RV % Predicted % 149     Lab Results  Component Value Date   NITRICOXIDE 10 03/29/2023        Assessment & Plan:   Moderate asthma with acute exacerbation Recurrent exacerbation likely secondary to URI given known sick exposures. COVID/flu negative. FeNO nl today, likely due to recent prednisone use; however, she has bronchospasm on exam, bronchitic cough and DOE so will challenge her with depo inj 80 mg x 1 and extended prednisone taper. Hold on antimicrobial therapy as likely viral in nature and she recently completed empiric azithromycin. Cough control measures. Will order her a neb machine and solution. Strict return/ED precautions. Close follow up. CXR today May need to consider biologic therapy. She has an allergic phenotype. No recent blood count testing as she's been on steroids during previous visits and today. Will obtain CBC with diff to assess peripheral eosinophils once she is off steroids for 4 weeks.   Patient Instructions  Continue Albuterol inhaler 2 puffs or 3 mL neb every 6 hours as needed for shortness of breath or wheezing. Notify if symptoms persist despite rescue inhaler/neb use. Use nebs 2-3 times a day until symptoms improve  Continue Breztri 2 puffs Twice daily. Brush tongue and rinse mouth afterwards Continue singulair 1 tab At bedtime Continue flonase nasal spray 2 sprays each nostril daily Take a daily  non drowsy allergy pill   -Prednisone taper. 4 tabs for 4 days, then 3 tabs for 4 days, 2 tabs for 4 days, then 1 tab for 4 days, then stop. Start tomorrow. Take in AM with food.  -Continue benzonatate 1 capsule Three times a day as needed for cough -Hycodan cough syrup  5 mL every 6 hours as needed for cough. May cause drowsiness. Do not drive after taking -Saline nasal rinses 1-2 times a day, use bottled distilled water then use flonase 20-30 minutes later -You can use sudafed over the counter for a few days -Guaifenesin (mucinex) [phone] mg Twice daily for cough/congestion   Chest x ray today  Follow up in 2 weeks with new MD or Katie Britnie Colville,NP. If symptoms do not improve or worsen, please contact office for sooner follow up or seek emergency care.    URI (upper respiratory infection) See above. Supportive care measures.   Allergic rhinitis Continue current allergy regimen    Advised if symptoms do not improve or worsen, to please contact office for sooner follow up or seek emergency care.   I spent 45 minutes of dedicated to the care of this patient on the date of this encounter to include pre-visit review of records, face-to-face time with the patient discussing conditions above, post visit ordering of testing, clinical documentation with the electronic health record, making appropriate referrals as documented, and communicating necessary findings to members of the patients care team.  Noemi Chapel, NP 03/30/2023  Pt aware and understands NP's role.

## 2023-03-29 NOTE — Patient Instructions (Addendum)
 Continue Albuterol inhaler 2 puffs or 3 mL neb every 6 hours as needed for shortness of breath or wheezing. Notify if symptoms persist despite rescue inhaler/neb use. Use nebs 2-3 times a day until symptoms improve  Continue Breztri 2 puffs Twice daily. Brush tongue and rinse mouth afterwards Continue singulair 1 tab At bedtime Continue flonase nasal spray 2 sprays each nostril daily Take a daily non drowsy allergy pill   -Prednisone taper. 4 tabs for 4 days, then 3 tabs for 4 days, 2 tabs for 4 days, then 1 tab for 4 days, then stop. Start tomorrow. Take in AM with food.  -Continue benzonatate 1 capsule Three times a day as needed for cough -Hycodan cough syrup 5 mL every 6 hours as needed for cough. May cause drowsiness. Do not drive after taking -Saline nasal rinses 1-2 times a day, use bottled distilled water then use flonase 20-30 minutes later -You can use sudafed over the counter for a few days -Guaifenesin (mucinex) [phone] mg Twice daily for cough/congestion   Chest x ray today  Follow up in 2 weeks with new MD or Rebecca Praise Stennett,NP. If symptoms do not improve or worsen, please contact office for sooner follow up or seek emergency care.

## 2023-03-30 ENCOUNTER — Encounter: Payer: Self-pay | Admitting: Nurse Practitioner

## 2023-03-30 DIAGNOSIS — J4550 Severe persistent asthma, uncomplicated: Secondary | ICD-10-CM | POA: Insufficient documentation

## 2023-03-30 DIAGNOSIS — J069 Acute upper respiratory infection, unspecified: Secondary | ICD-10-CM | POA: Insufficient documentation

## 2023-03-30 DIAGNOSIS — Z96641 Presence of right artificial hip joint: Secondary | ICD-10-CM | POA: Diagnosis not present

## 2023-03-30 DIAGNOSIS — J45901 Unspecified asthma with (acute) exacerbation: Secondary | ICD-10-CM | POA: Insufficient documentation

## 2023-03-30 DIAGNOSIS — J309 Allergic rhinitis, unspecified: Secondary | ICD-10-CM | POA: Insufficient documentation

## 2023-03-30 NOTE — Assessment & Plan Note (Addendum)
 Recurrent exacerbation likely secondary to URI given known sick exposures. COVID/flu negative. FeNO nl today, likely due to recent prednisone use; however, she has bronchospasm on exam, bronchitic cough and DOE so will challenge her with depo inj 80 mg x 1 and extended prednisone taper. Hold on antimicrobial therapy as likely viral in nature and she recently completed empiric azithromycin. Cough control measures. Will order her a neb machine and solution. Strict return/ED precautions. Close follow up. CXR today May need to consider biologic therapy. She has an allergic phenotype. No recent blood count testing as she's been on steroids during previous visits and today. Will obtain CBC with diff to assess peripheral eosinophils once she is off steroids for 4 weeks.   Patient Instructions  Continue Albuterol inhaler 2 puffs or 3 mL neb every 6 hours as needed for shortness of breath or wheezing. Notify if symptoms persist despite rescue inhaler/neb use. Use nebs 2-3 times a day until symptoms improve  Continue Breztri 2 puffs Twice daily. Brush tongue and rinse mouth afterwards Continue singulair 1 tab At bedtime Continue flonase nasal spray 2 sprays each nostril daily Take a daily non drowsy allergy pill   -Prednisone taper. 4 tabs for 4 days, then 3 tabs for 4 days, 2 tabs for 4 days, then 1 tab for 4 days, then stop. Start tomorrow. Take in AM with food.  -Continue benzonatate 1 capsule Three times a day as needed for cough -Hycodan cough syrup 5 mL every 6 hours as needed for cough. May cause drowsiness. Do not drive after taking -Saline nasal rinses 1-2 times a day, use bottled distilled water then use flonase 20-30 minutes later -You can use sudafed over the counter for a few days -Guaifenesin (mucinex) [phone] mg Twice daily for cough/congestion   Chest x ray today  Follow up in 2 weeks with new MD or Katie Belen Pesch,NP. If symptoms do not improve or worsen, please contact office for sooner follow  up or seek emergency care.

## 2023-03-30 NOTE — Assessment & Plan Note (Signed)
Continue current allergy regimen 

## 2023-03-30 NOTE — Assessment & Plan Note (Signed)
 See above. Supportive care measures.

## 2023-04-01 ENCOUNTER — Telehealth: Payer: Self-pay | Admitting: Nurse Practitioner

## 2023-04-01 NOTE — Telephone Encounter (Signed)
 Patrcia from [employer] mentions that the order is missing a narrative. I sent message to Elease Hashimoto for further explanation on the narrative if that's needed.

## 2023-04-01 NOTE — Telephone Encounter (Signed)
 She said that "Just mentioning That it was ordered to treat the Dx code. " I hope this helps.

## 2023-04-02 NOTE — Telephone Encounter (Signed)
 I'm not sure what is missing from the order. It's a nebulizer order for moderate asthma with acute exacerbation, which is what the ICD (dx code) is. Did she have any other explanation? Did the patient get the neb?

## 2023-04-07 NOTE — Telephone Encounter (Signed)
 Contacted [employer] who states its with a driver to be delivered today. Contacted patient to inform. Nothing further needed

## 2023-04-14 ENCOUNTER — Ambulatory Visit: Admitting: Nurse Practitioner

## 2023-04-14 ENCOUNTER — Encounter: Payer: Self-pay | Admitting: Nurse Practitioner

## 2023-04-14 VITALS — BP 130/80 | HR 71 | Ht 70.0 in | Wt 237.0 lb

## 2023-04-14 DIAGNOSIS — J4550 Severe persistent asthma, uncomplicated: Secondary | ICD-10-CM | POA: Diagnosis not present

## 2023-04-14 DIAGNOSIS — J309 Allergic rhinitis, unspecified: Secondary | ICD-10-CM | POA: Diagnosis not present

## 2023-04-14 DIAGNOSIS — B999 Unspecified infectious disease: Secondary | ICD-10-CM | POA: Insufficient documentation

## 2023-04-14 DIAGNOSIS — R5383 Other fatigue: Secondary | ICD-10-CM

## 2023-04-14 NOTE — Assessment & Plan Note (Signed)
 Severe asthma today with allergic phenotype.  She has had 3 exacerbations over the course of a year requiring steroids.  She is already maintained on triple therapy regimen and Singulair for trigger prevention.  At this point, recommend she start on biologic therapy -discussed side effect profile.  She is open to considering but would like to think about this more.  Given her IgE levels, could start her on Xolair or if she has peripheral eosinophilia, can consider alternative such as Dupixent.  Recently been on steroids which would falsely lower eos.  Will plan to recheck in 2 to 3 weeks and readdress conversation of biologic therapy.  In interim, trigger prevention reviewed.  Action plan in place.  Continue current regimen.  Patient Instructions  Continue Albuterol inhaler 2 puffs or 3 mL neb every 6 hours as needed for shortness of breath or wheezing. Notify if symptoms persist despite rescue inhaler/neb use. Use nebs 2-3 times a day until symptoms improve  Continue Breztri 2 puffs Twice daily. Brush tongue and rinse mouth afterwards Continue singulair 1 tab At bedtime Continue flonase nasal spray 2 sprays each nostril daily Take a daily non drowsy allergy pill   We may need to start you on biologic therapy for you asthma We will draw labs in 2.5-3 weeks and discuss this further. May adjust follow up based on this     Follow up in 3 months with Dr. Francine Graven, Dr. Celine Mans or Dr. Judeth Horn or Katie Armend Hochstatter,NP. If symptoms do not improve or worsen, please contact office for sooner follow up or seek emergency care.

## 2023-04-14 NOTE — Assessment & Plan Note (Signed)
Continue current allergy regimen 

## 2023-04-14 NOTE — Assessment & Plan Note (Signed)
 She is concerned about her immune system. She has had three exacerbations over the last year of her asthma. She does tell me she's had pneumonia in the last year and multiple times as an adult. Records not available. Will check IgG, IgA, and IgM. Consider immunology referral.

## 2023-04-14 NOTE — Progress Notes (Signed)
 @Patient  ID: Rebecca Stephens, female    DOB: Mar 08, 1955, 68 y.o.   MRN: [id number]  Chief Complaint  Patient presents with   Follow-up    Patient she feeling extreme fatigue, but chest feels clear.    Referring provider: No ref. provider found  HPI: 68 year old female, never smoker followed for asthma. She is a former patient of [name] and last seen in office 03/29/2023 by Rebecca Stephens. Past medical history significant for VSD, anxiety.   TEST/EVENTS:  08/21/2021 PFT: FVC 79, FEV1 76, ratio 78, TLC 116, DLCOcor 99. Midflow reversibility.  03/29/2023 CXR: hyperinflation. No acute process  08/11/2022: OV with Dr. Tonia Stephens.  Recurrent asthma exacerbations.  Last visit she was started on Flonase, Singulair, daily antihistamine.  On Trelegy inhaler.  Again started on a prednisone taper.  Absolute eosinophil count was not checked as she was recently on steroids prior to that.  Did have an IgE level elevated at 392.  RAST panel positive for cocklebur, goldenrod and ragweed.  Feeling better today.  Feels like from a respiratory standpoint she is doing great.  Did switch from Trelegy to [place].  If she has recurrent exacerbations would consider biologic in future.  03/29/3023: Sudie Grumbling with Rebecca Kingsford Stephens Granddaugther had the flu beginning of February. She developed flu like illness around 2/9. She started feeling worse around 2/14 and had a telehealth visit. She was prescribed z pack and prednisone taper. She was slowly feeling better up until this week. Her husband came home with cold symptoms about a week ago and then she started having a lot of sinus symptoms. She's now having more chest pressure and chest discomfort with coughing/deep breathing. She was having night sweats but didn't have any last night. Cough is productive with clear, pasty phlegm. She feels more short winded than usual. Has noticed wheezing. Finish the prednisone 2 days ago; felt better on it. Denies fevers, chills, hemoptysis, leg swelling, facial  tenderness, headaches. She COVID/flu swabbed at home which was negative. She's using her Breztri, singulair, and flonase. Appetite reduced. Drinking plenty of fluids.   04/14/2023: Today - follow up Patient presents today for follow up. She was treated for recurrent asthma exacerbation secondary to URI with extended prednisone taper and cough control measures. We also were more aggressive with sinus symptom management. She ended up not completing the entire prednisone taper as it made her very jittery. She took 40 mg for 4 days then 30 mg for 1 day then 10 mg for 4 days. She tells me she is feeling better today. Breathing is a lot better. She's not having any cough now and sinuses have cleared up. She is still feeling run down/fatigued. No fevers, chills, hemoptysis, wheezing, chest congestion. Not having to use her rescue inhaler or nebulizer treatments now. She did get the nebulizer machine, which she is happy to have. She uses Breztri, Quarry manager and flonase as prescribed.  She does have a history of VSD, followed by cardiology. No chest pain, increased palpitations, PND, dizziness/lightheadedness, syncope. She plans to schedule follow up with her cardiologist.   No Known Allergies  Immunization History  Administered Date(s) Administered   Influenza Inj Mdck Quad Pf 09/21/2018   PFIZER(Purple Top)SARS-COV-2 Vaccination 04/13/2019, 05/15/2019    Past Medical History:  Diagnosis Date   Asthma    Bronchitis    Cancer (HCC)    Breast   DCIS (ductal carcinoma in situ) of breast 2011   right breast   Heart disease  Hypertension    VSD (ventricular septal defect)     Tobacco History: Social History   Tobacco Use  Smoking Status Never  Smokeless Tobacco Never   Counseling given: Not Answered   Outpatient Medications Prior to Visit  Medication Sig Dispense Refill   albuterol (PROVENTIL) (2.5 MG/3ML) 0.083% nebulizer solution Take 3 mLs (2.5 mg total) by nebulization every 6 (six)  hours as needed for wheezing or shortness of breath. 75 mL 5   benzonatate (TESSALON) 200 MG capsule Take 1 capsule (200 mg total) by mouth 2 (two) times daily as needed for cough. 20 capsule 0   Budeson-Glycopyrrol-Formoterol (BREZTRI AEROSPHERE) 160-9-4.8 MCG/ACT AERO Inhale 2 puffs into the lungs daily. 10.7 g 11   escitalopram (LEXAPRO) 10 MG tablet Take 10 mg by mouth at bedtime.      fluticasone (FLONASE) 50 MCG/ACT nasal spray Place 2 sprays into both nostrils daily. 16 g 11   gabapentin (NEURONTIN) 300 MG capsule Take 300 mg by mouth at bedtime.     HYDROcodone bit-homatropine (HYCODAN) 5-1.5 MG/5ML syrup Take 5 mLs by mouth every 6 (six) hours as needed for cough. 180 mL 0   omeprazole (PRILOSEC) 20 MG capsule TAKE 1 CAPSULE BY MOUTH DAILY. 30 capsule 10   predniSONE (DELTASONE) 10 MG tablet 4 tabs for 4 days, then 3 tabs for 4 days, 2 tabs for 4 days, then 1 tab for 4 days, then stop 40 tablet 0   albuterol (VENTOLIN HFA) 108 (90 Base) MCG/ACT inhaler Inhale 2 puffs into the lungs every 6 (six) hours as needed for wheezing or shortness of breath. 8 g 2   No facility-administered medications prior to visit.     Review of Systems:   Constitutional: No weight loss or gain, fevers, chills, night sweats  +fatigue, lassitude,  HEENT: No headaches, difficulty swallowing, tooth/dental problems, or sore throat. No sneezing, itching, ear ache, nasal congestion/drainage, post nasal drip CV:  No chest pain, orthopnea, PND, swelling in lower extremities, anasarca, dizziness, palpitations, syncope Resp: No shortness of breath with exertion; cough; wheeze. No excess mucus or change in color of mucus. No hemoptysis. No chest wall deformity GI:  No heartburn, indigestion, abdominal pain, nausea, vomiting, diarrhea, change in bowel habits, loss of appetite, bloody stools.  GU: No dysuria, change in color of urine, urgency or frequency.   Skin: No rash, lesions, ulcerations MSK:  No joint pain or  swelling.  Neuro: No dizziness or lightheadedness.  Psych: No depression or anxiety. Mood stable.     Physical Exam:  BP 130/80 (BP Location: Left Arm, Patient Position: Sitting, Cuff Size: Normal)   Pulse 71   Ht 5\' 10"  (1.778 m)   Wt 237 lb (107.5 kg)   SpO2 99%   BMI 34.01 kg/m   GEN: Pleasant, interactive, well-appearing; in no acute distress HEENT:  Normocephalic and atraumatic. EACs patent bilaterally. TM pearly gray with present light reflex bilaterally. PERRLA. Sclera white. Nasal turbinates pink, moist and patent bilaterally. No rhinorrhea present. Oropharynx pink and moist, without exudate or edema. No lesions, ulcerations NECK:  Supple w/ fair ROM. No JVD present. Normal carotid impulses w/o bruits. Thyroid symmetrical with no goiter or nodules palpated. No lymphadenopathy.   CV: RRR, systolic murmur, no peripheral edema. Pulses intact, +2 bilaterally. No cyanosis, pallor or clubbing. PULMONARY:  Unlabored, regular breathing. Clear bilaterally A&P w/o wheezes/rales/rhonchi. No accessory muscle use.  GI: BS present and normoactive. Soft, non-tender to palpation. No organomegaly or masses detected. MSK: No erythema, warmth  or tenderness. Cap refil <2 sec all extrem. No deformities or joint swelling noted.  Neuro: A/Ox3. No focal deficits noted.   Skin: Warm, no lesions or rashe Psych: Normal affect and behavior. Judgement and thought content appropriate.     Lab Results:  CBC    Component Value Date/Time   WBC 6.2 11/23/2017 1226   WBC 6.0 07/07/2016 1244   RBC 4.45 11/23/2017 1226   RBC 4.44 07/07/2016 1244   HGB 12.8 11/23/2017 1226   HCT 37.9 11/23/2017 1226   PLT 261 11/23/2017 1226   MCV 85 11/23/2017 1226   MCH 28.8 11/23/2017 1226   MCH 29.1 07/07/2016 1244   MCHC 33.8 11/23/2017 1226   MCHC 32.3 07/07/2016 1244   RDW 13.3 11/23/2017 1226   LYMPHSABS 1,740 07/07/2016 1244   MONOABS 420 07/07/2016 1244   EOSABS 180 07/07/2016 1244   BASOSABS 60  07/07/2016 1244    BMET    Component Value Date/Time   NA 141 11/23/2017 1226   K 4.7 11/23/2017 1226   CL 102 11/23/2017 1226   CO2 22 11/23/2017 1226   GLUCOSE 91 11/23/2017 1226   GLUCOSE 92 11/13/2009 1209   BUN 14 11/23/2017 1226   CREATININE 0.77 11/23/2017 1226   CALCIUM 9.3 11/23/2017 1226   GFRNONAA 84 11/23/2017 1226   GFRAA 97 11/23/2017 1226    BNP No results found for: "BNP"   Imaging:  DG Chest 2 View Result Date: 03/29/2023 CLINICAL DATA:  Productive cough.  Asthma exacerbation. EXAM: CHEST - 2 VIEW COMPARISON:  X-ray 05/06/2021. FINDINGS: There is some linear opacity lung bases likely scar or atelectasis. No pleural effusion. No consolidation. Normal cardiopericardial silhouette. Degenerative changes seen along the spine. IMPRESSION: Hyperinflation.  Basilar scar or atelectasis. Electronically Signed   By: Karen Kays M.D.   On: 03/29/2023 18:02    albuterol (PROVENTIL) (2.5 MG/3ML) 0.083% nebulizer solution 2.5 mg     Date Action Dose Route User   03/29/2023 1632 Given 2.5 mg Nebulization Christen Butter, CMA      methylPREDNISolone acetate (DEPO-MEDROL) injection 80 mg     Date Action Dose Route User   03/29/2023 1631 Given 80 mg Intramuscular (Right Upper Outer Quadrant) Christen Butter, CMA          Latest Ref Rng & Units 08/21/2021   11:09 AM  PFT Results  FVC-Pre L 3.15   FVC-Predicted Pre % 79   FVC-Post L 3.29   FVC-Predicted Post % 83   Pre FEV1/FVC % % 74   Post FEV1/FCV % % 78   FEV1-Pre L 2.34   FEV1-Predicted Pre % 76   FEV1-Post L 2.55   DLCO uncorrected ml/min/mmHg 24.81   DLCO UNC% % 102   DLCO corrected ml/min/mmHg 24.17   DLCO COR %Predicted % 99   DLVA Predicted % 98   TLC L 6.96   TLC % Predicted % 116   RV % Predicted % 149     Lab Results  Component Value Date   NITRICOXIDE 10 03/29/2023        Assessment & Plan:   Severe persistent asthma Severe asthma today with allergic phenotype.  She has had 3  exacerbations over the course of a year requiring steroids.  She is already maintained on triple therapy regimen and Singulair for trigger prevention.  At this point, recommend she start on biologic therapy -discussed side effect profile.  She is open to considering but would like to think about this  more.  Given her IgE levels, could start her on Xolair or if she has peripheral eosinophilia, can consider alternative such as Dupixent.  Recently been on steroids which would falsely lower eos.  Will plan to recheck in 2 to 3 weeks and readdress conversation of biologic therapy.  In interim, trigger prevention reviewed.  Action plan in place.  Continue current regimen.  Patient Instructions  Continue Albuterol inhaler 2 puffs or 3 mL neb every 6 hours as needed for shortness of breath or wheezing. Notify if symptoms persist despite rescue inhaler/neb use. Use nebs 2-3 times a day until symptoms improve  Continue Breztri 2 puffs Twice daily. Brush tongue and rinse mouth afterwards Continue singulair 1 tab At bedtime Continue flonase nasal spray 2 sprays each nostril daily Take a daily non drowsy allergy pill   We may need to start you on biologic therapy for you asthma We will draw labs in 2.5-3 weeks and discuss this further. May adjust follow up based on this     Follow up in 3 months with Dr. Francine Graven, Dr. Celine Mans or Dr. Judeth Horn or Katie Dewanda Fennema,Stephens. If symptoms do not improve or worsen, please contact office for sooner follow up or seek emergency care.    Allergic rhinitis Continue current allergy regimen  Fatigue Likely postviral.  Discussed that it could take her a few weeks to feel some improvement.  If she has persistent fatigue symptoms, can obtain further workup to rule out underlying etiology such as anemia, thyroid disease, worsening cardiac disease   Recurrent infections She is concerned about her immune system. She has had three exacerbations over the last year of her asthma. She does tell  me she's had pneumonia in the last year and multiple times as an adult. Records not available. Will check IgG, IgA, and IgM. Consider immunology referral.     Advised if symptoms do not improve or worsen, to please contact office for sooner follow up or seek emergency care.   I spent 35 minutes of dedicated to the care of this patient on the date of this encounter to include pre-visit review of records, face-to-face time with the patient discussing conditions above, post visit ordering of testing, clinical documentation with the electronic health record, making appropriate referrals as documented, and communicating necessary findings to members of the patients care team.  Noemi Chapel, Stephens 04/14/2023  Pt aware and understands Stephens's role.

## 2023-04-14 NOTE — Assessment & Plan Note (Signed)
 Likely postviral.  Discussed that it could take her a few weeks to feel some improvement.  If she has persistent fatigue symptoms, can obtain further workup to rule out underlying etiology such as anemia, thyroid disease, worsening cardiac disease

## 2023-04-14 NOTE — Patient Instructions (Addendum)
 Continue Albuterol inhaler 2 puffs or 3 mL neb every 6 hours as needed for shortness of breath or wheezing. Notify if symptoms persist despite rescue inhaler/neb use. Use nebs 2-3 times a day until symptoms improve  Continue Breztri 2 puffs Twice daily. Brush tongue and rinse mouth afterwards Continue singulair 1 tab At bedtime Continue flonase nasal spray 2 sprays each nostril daily Take a daily non drowsy allergy pill   We may need to start you on biologic therapy for you asthma We will draw labs in 2.5-3 weeks and discuss this further. May adjust follow up based on this     Follow up in 3 months with Dr. Francine Graven, Dr. Celine Mans or Dr. Judeth Horn or Katie Keya Wynes,NP. If symptoms do not improve or worsen, please contact office for sooner follow up or seek emergency care.

## 2023-05-10 ENCOUNTER — Other Ambulatory Visit (INDEPENDENT_AMBULATORY_CARE_PROVIDER_SITE_OTHER)

## 2023-05-10 DIAGNOSIS — J4550 Severe persistent asthma, uncomplicated: Secondary | ICD-10-CM | POA: Diagnosis not present

## 2023-05-10 LAB — CBC WITH DIFFERENTIAL/PLATELET
Basophils Absolute: 0 10*3/uL (ref 0.0–0.1)
Basophils Relative: 0.5 % (ref 0.0–3.0)
Eosinophils Absolute: 0.1 10*3/uL (ref 0.0–0.7)
Eosinophils Relative: 1.3 % (ref 0.0–5.0)
HCT: 41 % (ref 36.0–46.0)
Hemoglobin: 13.7 g/dL (ref 12.0–15.0)
Lymphocytes Relative: 19.9 % (ref 12.0–46.0)
Lymphs Abs: 1.5 10*3/uL (ref 0.7–4.0)
MCHC: 33.5 g/dL (ref 30.0–36.0)
MCV: 87.8 fl (ref 78.0–100.0)
Monocytes Absolute: 0.5 10*3/uL (ref 0.1–1.0)
Monocytes Relative: 6.5 % (ref 3.0–12.0)
Neutro Abs: 5.4 10*3/uL (ref 1.4–7.7)
Neutrophils Relative %: 71.8 % (ref 43.0–77.0)
Platelets: 275 10*3/uL (ref 150.0–400.0)
RBC: 4.67 Mil/uL (ref 3.87–5.11)
RDW: 15.6 % — ABNORMAL HIGH (ref 11.5–15.5)
WBC: 7.5 10*3/uL (ref 4.0–10.5)

## 2023-05-11 LAB — IGG, IGA, IGM
IgG (Immunoglobin G), Serum: 763 mg/dL (ref 600–1540)
IgM, Serum: 36 mg/dL — ABNORMAL LOW (ref 50–300)
Immunoglobulin A: 148 mg/dL (ref 70–320)

## 2023-05-13 NOTE — Progress Notes (Signed)
 IgM low with recurrent infections. Please send referral to immunology. Thanks!

## 2023-05-17 ENCOUNTER — Other Ambulatory Visit: Payer: Self-pay

## 2023-05-17 DIAGNOSIS — B999 Unspecified infectious disease: Secondary | ICD-10-CM

## 2023-05-17 LAB — ALLERGEN PANEL (27) + IGE
Alternaria Alternata IgE: <0.1 kU/L
Aspergillus Fumigatus IgE: <0.1 kU/L
Bahia Grass IgE: <0.1 kU/L
Bermuda Grass IgE: <0.1 kU/L
Cat Dander IgE: <0.1 kU/L
Cedar, Mountain IgE: <0.1 kU/L
Cladosporium Herbarum IgE: <0.1 kU/L
Cocklebur IgE: <0.1 kU/L
Cockroach, American IgE: <0.1 kU/L
Common Silver Birch IgE: <0.1 kU/L
D Farinae IgE: 7.25 kU/L — AB
D Pteronyssinus IgE: 6.63 kU/L — AB
Dog Dander IgE: <0.1 kU/L
Elm, American IgE: <0.1 kU/L
Hickory, White IgE: <0.1 kU/L
IgE (Immunoglobulin E), Serum: 692 [IU]/mL — ABNORMAL HIGH (ref 6–495)
Johnson Grass IgE: <0.1 kU/L
Kentucky Bluegrass IgE: <0.1 kU/L
Maple/Box Elder IgE: <0.1 kU/L
Mucor Racemosus IgE: <0.1 kU/L
Oak, White IgE: <0.1 kU/L
Penicillium Chrysogen IgE: 0.43 kU/L — AB
Pigweed, Rough IgE: <0.1 kU/L
Plantain, English IgE: <0.1 kU/L
Ragweed, Short IgE: 0.57 kU/L — AB
Setomelanomma Rostrat: <0.1 kU/L
Timothy Grass IgE: <0.1 kU/L
White Mulberry IgE: <0.1 kU/L

## 2023-05-23 DIAGNOSIS — Z96641 Presence of right artificial hip joint: Secondary | ICD-10-CM | POA: Diagnosis not present

## 2023-07-14 DIAGNOSIS — G4734 Idiopathic sleep related nonobstructive alveolar hypoventilation: Secondary | ICD-10-CM | POA: Diagnosis not present

## 2023-07-14 DIAGNOSIS — G4733 Obstructive sleep apnea (adult) (pediatric): Secondary | ICD-10-CM | POA: Diagnosis not present

## 2023-07-15 ENCOUNTER — Encounter: Payer: Self-pay | Admitting: Allergy & Immunology

## 2023-07-15 ENCOUNTER — Ambulatory Visit: Admitting: Allergy & Immunology

## 2023-07-15 ENCOUNTER — Other Ambulatory Visit: Payer: Self-pay

## 2023-07-15 VITALS — BP 122/72 | HR 80 | Temp 97.7°F | Resp 18 | Ht 69.29 in | Wt 242.1 lb

## 2023-07-15 DIAGNOSIS — B999 Unspecified infectious disease: Secondary | ICD-10-CM

## 2023-07-15 DIAGNOSIS — J3089 Other allergic rhinitis: Secondary | ICD-10-CM | POA: Diagnosis not present

## 2023-07-15 DIAGNOSIS — J302 Other seasonal allergic rhinitis: Secondary | ICD-10-CM

## 2023-07-15 DIAGNOSIS — J4550 Severe persistent asthma, uncomplicated: Secondary | ICD-10-CM

## 2023-07-15 NOTE — Progress Notes (Signed)
 NEW PATIENT  Date of Service/Encounter:  07/15/23  Consult requested by: Suzzette Eth, MD (Inactive)   Assessment:   Severe persistent asthma without complication  Recurrent infections  Seasonal and perennial allergic rhinitis (ragweed, mold, dust mites) - could consider intradermal testing in the future  Plan/Recommendations:   There are no Patient Instructions on file for this visit.   {Blank single:19197::This note in its entirety was forwarded to the Provider who requested this consultation.}  Subjective:   Rebecca Stephens is a 68 y.o. female presenting today for evaluation of  Chief Complaint  Patient presents with   Allergies    Rebecca Stephens has a history of the following: Patient Active Problem List   Diagnosis Date Noted   Fatigue 04/14/2023   Recurrent infections 04/14/2023   Severe persistent asthma 03/30/2023   URI (upper respiratory infection) 03/30/2023   Allergic rhinitis 03/30/2023   Personal history of COVID-19 04/12/2019   Ventricular septal defect 10/18/2017   Lobular carcinoma in situ 08/06/2010    History obtained from: chart review and {Persons; PED relatives w/patient:19415::patient}.  Discussed the use of AI scribe software for clinical note transcription with the patient and/or guardian, who gave verbal consent to proceed.  [name] was referred by Suzzette Eth, MD (Inactive).     [name] is a 68 y.o. female presenting for {Blank single:19197::a food challenge,a drug challenge,skin testing,a sick visit,an evaluation of ***,a follow up visit}.    Asthma/Respiratory Symptom History: ***  Allergic Rhinitis Symptom History: ***  Food Allergy Symptom History: ***  Skin Symptom History: ***  GERD Symptom History: ***  Infection Symptom History: ***  Immunodeficiency screen: Eight or more ear infections in one year: {Blank single:19197::YES,NO} Two or more serious sinus infections in one  year: {Blank single:19197::YES,NO} Two or more bouts of pneumonia in one year: {Blank single:19197::YES,NO} Two or more deep-seated infections, or infections in unusual areas: {Blank single:19197::YES,NO} Recurrent deep skin or organ abscesses: {Blank single:19197::YES,NO} Need for intravenous antibiotic therapy to clear infection: {Blank single:19197::YES,NO} Infections with unusual or opportunistic organisms: {Blank single:19197::YES,NO} Family history of primary immunodeficiency: {Blank single:19197::YES,NO} Recurrent fevers: {Blank single:19197::YES,NO}  Physical signs screen: Poor growth, failure to thrive: {Blank single:19197::YES,NO} Absent lymph nodes or tonsils: {Blank single:19197::YES,NO} Skin lesions: telangiectasias, petechiae, dermatomyositis, lupus-like rash: {Blank single:19197::YES,NO} Ataxia (with ataxia-telangiectasia): {Blank single:19197::YES,NO} Oral thrush after one year of age: {Blank single:19197::YES,NO} Oral ulcers: {Blank single:19197::YES,NO}   ***Otherwise, there is no history of other atopic diseases, including {Blank multiple:19196:o:asthma,food allergies,drug allergies,environmental allergies,stinging insect allergies,eczema,urticaria,contact dermatitis}. There is no significant infectious history. ***Vaccinations are up to date.    Past Medical History: Patient Active Problem List   Diagnosis Date Noted   Fatigue 04/14/2023   Recurrent infections 04/14/2023   Severe persistent asthma 03/30/2023   URI (upper respiratory infection) 03/30/2023   Allergic rhinitis 03/30/2023   Personal history of COVID-19 04/12/2019   Ventricular septal defect 10/18/2017   Lobular carcinoma in situ 08/06/2010    Medication List:  Allergies as of 07/15/2023   No Known Allergies      Medication List        Accurate as of July 15, 2023  2:16 PM. If you have any questions, ask your nurse or  doctor.          albuterol  108 (90 Base) MCG/ACT inhaler Commonly known as: VENTOLIN  HFA Inhale 2 puffs into the lungs every 6 (six) hours as needed for wheezing or shortness of breath.   albuterol  (2.5 MG/3ML) 0.083%  nebulizer solution Commonly known as: PROVENTIL  Take 3 mLs (2.5 mg total) by nebulization every 6 (six) hours as needed for wheezing or shortness of breath.   benzonatate  200 MG capsule Commonly known as: TESSALON  Take 1 capsule (200 mg total) by mouth 2 (two) times daily as needed for cough.   Breztri  Aerosphere 160-9-4.8 MCG/ACT Aero inhaler Generic drug: budesonide-glycopyrrolate-formoterol Inhale 2 puffs into the lungs daily.   escitalopram 10 MG tablet Commonly known as: LEXAPRO Take 10 mg by mouth at bedtime.   fluticasone  50 MCG/ACT nasal spray Commonly known as: FLONASE  Place 2 sprays into both nostrils daily.   gabapentin 300 MG capsule Commonly known as: NEURONTIN Take 300 mg by mouth at bedtime.   HYDROcodone  bit-homatropine 5-1.5 MG/5ML syrup Commonly known as: HYCODAN Take 5 mLs by mouth every 6 (six) hours as needed for cough.   omeprazole  20 MG capsule Commonly known as: PRILOSEC TAKE 1 CAPSULE BY MOUTH DAILY.   predniSONE  10 MG tablet Commonly known as: DELTASONE  4 tabs for 4 days, then 3 tabs for 4 days, 2 tabs for 4 days, then 1 tab for 4 days, then stop        Birth History: non-contributory  Developmental History: non-contributory  Past Surgical History: Past Surgical History:  Procedure Laterality Date   BREAST SURGERY     carcinoma removal   CARDIAC CATHETERIZATION     MOUTH SURGERY     bone removed from under tongue   RIGHT/LEFT HEART CATH AND CORONARY ANGIOGRAPHY N/A 11/25/2017   Procedure: RIGHT/LEFT HEART CATH AND CORONARY ANGIOGRAPHY;  Surgeon: Mardell Shade, MD;  Location: [facility];  Service: Cardiovascular;  Laterality: N/A;   TOTAL HIP ARTHROPLASTY  2004   right hip     Family  History: Family History  Problem Relation Age of Onset   Clotting disorder Father    Breast cancer Mother 15   Breast cancer Maternal Grandmother 36   Cancer Maternal Grandfather        LEG TUMOR   Cancer Paternal Grandfather        COLON   Stroke Paternal Grandfather    Stroke Paternal Grandmother      Social History: Rebecca Stephens lives at home with her family. She lives in a house that is 67 years old. There is tile in the main living areas and hardwoods in the bedroom. She has electric heating and central cooling. There are four dogs inside of the home. There are dust mite coverings on the bedding. There is no tobacco exposure in the home. She is not exposed to tobacco at all. 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 There are no fumes, chemicals, or dust exposures in the home. She does not live near an interstate or industrial area.    Review of systems otherwise negative other than that mentioned in the HPI.    Objective:   Blood pressure 122/72, pulse 80, temperature 97.7 F (36.5 C), temperature source Temporal, resp. rate 18, height 5' 9.29 (1.76 m), weight 242 lb 1.6 oz (109.8 kg), SpO2 95%. Body mass index is 35.45 kg/m.     Physical Exam   Diagnostic studies: {Blank single:19197::none,deferred due to recent antihistamine use,deferred due to insurance stipulations that require a separate visit for testing,labs sent instead, }  Spirometry: results normal (FEV1: 1.95/72%, FVC: 2.86/81%, FEV1/FVC: 68%).    Spirometry consistent with normal pattern. {Blank single:19197::Albuterol /Atrovent nebulizer,Xopenex/Atrovent nebulizer,Albuterol  nebulizer,Albuterol  four puffs via MDI,Xopenex four puffs via MDI} treatment given in  clinic with {Blank single:19197::significant improvement in FEV1 per ATS criteria,significant improvement in FVC per ATS criteria,significant improvement in FEV1 and FVC per ATS criteria,improvement in FEV1, but not  significant per ATS criteria,improvement in FVC, but not significant per ATS criteria,improvement in FEV1 and FVC, but not significant per ATS criteria,no improvement}.  Allergy Studies: {Blank single:19197::none,deferred due to recent antihistamine use,deferred due to insurance stipulations that require a separate visit for testing,labs sent instead, }    {Blank single:19197::Allergy testing results were read and interpreted by myself, documented by clinical staff., }         Drexel Gentles, MD [facility] 

## 2023-07-15 NOTE — Patient Instructions (Addendum)
 1. Severe persistent asthma without complication - Lung testing looks stellar today. - Canary Ceo is doing an excellent job with managing your asthma. - We may consider doing an injectable asthma medication. - Daily controller medication(s): Breztri  160/9/4.13mcg two puffs twice daily - Prior to physical activity: albuterol  2 puffs 10-15 minutes before physical activity. - Rescue medications: albuterol  4 puffs every 4-6 hours as needed - Asthma control goals:  * Full participation in all desired activities (may need albuterol  before activity) * Albuterol  use two time or less a week on average (not counting use with activity) * Cough interfering with sleep two time or less a month * Oral steroids no more than once a year * No hospitalizations  2. Recurrent infections - You did have a low IgM with the labs that [facility] ordered.  - This is one of the immunoglobulin types that work on binding with bacteria and viruses and neutralizing them. - IgM is the least important immunoglobulin, so thankfully the IgG and IgA were both within normal limits.  - We are going to go ahead and do some more labs to evaluate your immune system. - We will obtain some screening labs to evaluate your immune system.  - Labs to evaluate the quantitative [facility]) aspects of your immune system: IgG/IgA/IgM, CBC with differential - Labs to evaluate the qualitative (HOW WELL THEY WORK) aspects of your immune system: CH50, Pneumococcal titers, Tetanus titers, Diphtheria titers - We may consider immunizations with Pneumovax and Tdap to challenge your immune system, and then obtain repeat titers in 4-6 weeks.   3. Seasonal and perennial allergic rhinitis (ragweed, mold, dust mites) - It seems that your nasal symptoms are under excellent control. - Continue with the use of an antihistamine as needed.   4. GERD  - Continue with omeprazole  40mg  one to two times daily.   5. Return in about 2 weeks (around 07/29/2023) for  INTRADERMAL TESTING. You can have the follow up appointment with Dr. Idolina Maker or a Nurse Practicioner (our Nurse Practitioners are excellent and always have Physician oversight!).    Please inform us  of any Emergency Department visits, hospitalizations, or changes in symptoms. Call us  before going to the ED for breathing or allergy symptoms since we might be able to fit you in for a sick visit. Feel free to contact us  anytime with any questions, problems, or concerns.  It was a pleasure to meet you today!  Websites that have reliable patient information: 1. American Academy of Asthma, Allergy, and Immunology: www.aaaai.org 2. Food Allergy Research and Education (FARE): foodallergy.org 3. Mothers of Asthmatics: http://www.asthmacommunitynetwork.org 4. American College of Allergy, Asthma, and Immunology: www.acaai.org      "Like" us  on Facebook and Instagram for our latest updates!      A healthy democracy works best when [employer] participate! Make sure you are registered to vote! If you have moved or changed any of your contact information, you will need to get this updated before voting! Scan the QR codes below to learn more!

## 2023-07-16 ENCOUNTER — Encounter: Payer: Self-pay | Admitting: Allergy & Immunology

## 2023-07-16 NOTE — Addendum Note (Signed)
 Addended by: Norville Beery, Linh Johannes on: 07/16/2023 03:19 PM   Modules accepted: Orders

## 2023-07-20 ENCOUNTER — Ambulatory Visit: Admitting: Nurse Practitioner

## 2023-07-20 DIAGNOSIS — G4734 Idiopathic sleep related nonobstructive alveolar hypoventilation: Secondary | ICD-10-CM | POA: Diagnosis not present

## 2023-07-20 DIAGNOSIS — J4540 Moderate persistent asthma, uncomplicated: Secondary | ICD-10-CM | POA: Diagnosis not present

## 2023-07-21 ENCOUNTER — Ambulatory Visit: Payer: Self-pay | Admitting: Allergy & Immunology

## 2023-07-21 LAB — DIPHTHERIA / TETANUS ANTIBODY PANEL
Diphtheria Ab: 0.22 [IU]/mL (ref <0.10)
Tetanus Ab, IgG: 1.29 [IU]/mL (ref <0.10)

## 2023-07-21 LAB — STREP PNEUMONIAE 23 SEROTYPES IGG
Pneumo Ab Type 14*: 6 ug/mL (ref >1.3)
Pneumo Ab Type 19 (19F)*: 1.3 ug/mL — ABNORMAL LOW (ref >1.3)
Pneumo Ab Type 20*: 0.2 ug/mL — ABNORMAL LOW (ref >1.3)
Pneumo Ab Type 34 (10A)*: 0.3 ug/mL — ABNORMAL LOW (ref >1.3)
Pneumo Ab Type 4*: 0.3 ug/mL — ABNORMAL LOW (ref >1.3)
Pneumo Ab Type 43 (11A)*: 0.7 ug/mL — ABNORMAL LOW (ref >1.3)
Pneumo Ab Type 54 (15B)*: 1.9 ug/mL (ref >1.3)
Pneumo Ab Type 56 (18C)*: 0.4 ug/mL — ABNORMAL LOW (ref >1.3)
Pneumo Ab Type 57 (19A)*: 0.6 ug/mL — ABNORMAL LOW (ref >1.3)
Pneumo Ab Type 70 (33F)*: 2.1 ug/mL (ref >1.3)

## 2023-07-21 LAB — CBC WITH DIFF/PLATELET
Basophils Absolute: 0.1 10*3/uL (ref 0.0–0.2)
Basos: 1 %
EOS (ABSOLUTE): 0.2 10*3/uL (ref 0.0–0.4)
Eos: 2 %
Hematocrit: 43.8 % (ref 34.0–46.6)
Hemoglobin: 13.9 g/dL (ref 11.1–15.9)
Immature Grans (Abs): 0 10*3/uL (ref 0.0–0.1)
Immature Granulocytes: 0 %
Lymphocytes Absolute: 1.7 10*3/uL (ref 0.7–3.1)
Lymphs: 25 %
MCH: 28.9 pg (ref 26.6–33.0)
MCHC: 31.7 g/dL (ref 31.5–35.7)
MCV: 91 fL (ref 79–97)
Monocytes Absolute: 0.5 10*3/uL (ref 0.1–0.9)
Monocytes: 7 %
Neutrophils Absolute: 4.5 10*3/uL (ref 1.4–7.0)
Neutrophils: 64 %
Platelets: 295 10*3/uL (ref 150–450)
RBC: 4.81 x10E6/uL (ref 3.77–5.28)
RDW: 12.9 % (ref 11.7–15.4)
WBC: 7 10*3/uL (ref 3.4–10.8)

## 2023-07-21 LAB — IGG, IGA, IGM
IgA/Immunoglobulin A, Serum: 154 mg/dL (ref 87–352)
IgG (Immunoglobin G), Serum: 824 mg/dL (ref 586–1602)
IgM (Immunoglobulin M), Srm: 39 mg/dL (ref 26–217)

## 2023-07-21 LAB — COMPLEMENT, TOTAL: Compl, Total (CH50): >60 U/mL (ref >41)

## 2023-08-05 ENCOUNTER — Encounter: Payer: Self-pay | Admitting: Allergy & Immunology

## 2023-08-05 ENCOUNTER — Ambulatory Visit: Admitting: Allergy & Immunology

## 2023-08-05 DIAGNOSIS — J302 Other seasonal allergic rhinitis: Secondary | ICD-10-CM | POA: Diagnosis not present

## 2023-08-05 DIAGNOSIS — J3089 Other allergic rhinitis: Secondary | ICD-10-CM

## 2023-08-05 MED ORDER — TRIAMCINOLONE ACETONIDE 55 MCG/ACT NA AERO: 1.0000 | INHALATION_SPRAY | Freq: Two times a day (BID) | NASAL | 12 refills | Status: AC

## 2023-08-05 MED ORDER — AZELASTINE HCL 0.1 % NA SOLN: 1.0000 | Freq: Two times a day (BID) | NASAL | 12 refills | Status: AC

## 2023-08-05 NOTE — Progress Notes (Signed)
 FOLLOW UP  Date of Service/Encounter:  08/05/23   Assessment:   Severe persistent asthma without complication   Recurrent infections - with isolated IgM marked as low (performing more extensive workup at this point)   Seasonal and perennial allergic rhinitis (ragweed, mold, dust mites) - could consider intradermal testing in the future  Plan/Recommendations:   1. Severe persistent asthma without complication - Lung testing not done today. - Continue to follow with Dr. McQuaid.  - Daily controller medication(s): Breztri  160/9/4.44mcg two puffs twice daily - Prior to physical activity: albuterol  2 puffs 10-15 minutes before physical activity. - Rescue medications: albuterol  4 puffs every 4-6 hours as needed - Asthma control goals:  * Full participation in all desired activities (may need albuterol  before activity) * Albuterol  use two time or less a week on average (not counting use with activity) * Cough interfering with sleep two time or less a month * Oral steroids no more than once a year * No hospitalizations  2. Recurrent infections - with inadequate protection against Streptococcus pneumonia - I would recommend that you get a Prevnar-20 and we can get repeat titers. - Wait 4-6 weeks and we can get a repeat level to make sure you responded well to the vaccine.   3. Seasonal and perennial allergic rhinitis (ragweed, mold, dust mites) - Blood work testing was positive to dust mites, ragweed, and indoor molds.  - Testing today on the skin was positive to weed mix, trees, indoor molds, and outdoor molds - Use Nasacort  one spray per nostril twice daily. - Start Astelin  one spray per nostril twice daily.   4. GERD  - Continue with omeprazole  40mg  one to two times daily.   5. Return in about 3 months (around 11/05/2023). You can have the follow up appointment with Dr. Iva or a Nurse Practicioner (our Nurse Practitioners are excellent and always have Physician  oversight!).   Subjective:   Rebecca Stephens is a 68 y.o. female presenting today for follow up of  Chief Complaint  Patient presents with   Allergy  Testing    Intradermal all except DM, Ragweed, M2.    Rebecca Stephens has a history of the following: Patient Active Problem List   Diagnosis Date Noted   Fatigue 04/14/2023   Recurrent infections 04/14/2023   Severe persistent asthma 03/30/2023   URI (upper respiratory infection) 03/30/2023   Allergic rhinitis 03/30/2023   Personal history of COVID-19 04/12/2019   Ventricular septal defect 10/18/2017   Lobular carcinoma in situ 08/06/2010    History obtained from: chart review and patient.  Discussed the use of AI scribe software for clinical note transcription with the patient and/or guardian, who gave verbal consent to proceed.  Rebecca Stephens is a 68 y.o. female presenting for skin testing. She was last seen on June 19th. We could not do testing because her insurance company does not cover testing on the same day as a New Patient visit. She has been off of all antihistamines 3 days in anticipation of the testing.   At that last visit, she was already seeing pulmonology to manage her severe persistent asthma.  We did not make any changes.  We did talk about her immune workup.  She had a low IgM, but otherwise everything else was normal.  We obtained some additional screening labs to look at her immune system.  We obtained an environmental allergy  panel via the blood since we are getting blood anyway.  She had inadequate protection against  Streptococcus pneumonia with protection only 2 out of 23 serotypes.  We recommended getting a Prevnar 20 or Pneumovax and repeat titers in 4 to 6 weeks.pneu  Otherwise, there have been no changes to her past medical history, surgical history, family history, or social history.    Review of systems otherwise negative other than that mentioned in the HPI.    Objective:   There were no vitals  taken for this visit. There is no height or weight on file to calculate BMI.    Physical exam deferred since this was a skin testing appointment only.   Diagnostic studies:   Allergy  Studies:     Intradermal - 08/05/23 1009     Time Antigen Placed 1009    Allergen Manufacturer Jestine    Location Arm    Number of Test 13    Control 2+    [place] Negative    [place] Negative    [place] Negative    7 Grass Negative    Weed Mix 4+    Tree Mix 2+    Mold 1 2+    Mold 3 2+    Mold 4 Negative    Cat Negative    Dog Negative    Cockroach Negative          Allergy  testing results were read and interpreted by myself, documented by clinical staff.      Marty Shaggy, MD  [facility] 

## 2023-08-05 NOTE — Patient Instructions (Addendum)
 1. Severe persistent asthma without complication - Lung testing not done today. - Continue to follow with Dr. McQuaid.  - Daily controller medication(s): Breztri  160/9/4.47mcg two puffs twice daily - Prior to physical activity: albuterol  2 puffs 10-15 minutes before physical activity. - Rescue medications: albuterol  4 puffs every 4-6 hours as needed - Asthma control goals:  * Full participation in all desired activities (may need albuterol  before activity) * Albuterol  use two time or less a week on average (not counting use with activity) * Cough interfering with sleep two time or less a month * Oral steroids no more than once a year * No hospitalizations  2. Recurrent infections - with inadequate protection against Streptococcus pneumonia - I would recommend that you get a Prevnar-20 and we can get repeat titers. - Wait 4-6 weeks and we can get a repeat level to make sure you responded well to the vaccine.   3. Seasonal and perennial allergic rhinitis (ragweed, mold, dust mites) - Blood work testing was positive to dust mites, ragweed, and indoor molds.  - Testing today on the skin was positive to weed mix, trees, indoor molds, and outdoor molds - Use Nasacort  one spray per nostril twice daily. - Start Astelin  one spray per nostril twice daily.   4. GERD  - Continue with omeprazole  40mg  one to two times daily.   5. Return in about 3 months (around 11/05/2023). You can have the follow up appointment with Dr. Iva or a Nurse Practicioner (our Nurse Practitioners are excellent and always have Physician oversight!).    Please inform us  of any Emergency Department visits, hospitalizations, or changes in symptoms. Call us  before going to the ED for breathing or allergy  symptoms since we might be able to fit you in for a sick visit. Feel free to contact us  anytime with any questions, problems, or concerns.  It was a pleasure to see you again today!  Websites that have reliable patient  information: 1. American Academy of Asthma, Allergy , and Immunology: www.aaaai.org 2. Food Allergy  Research and Education (FARE): foodallergy.org 3. Mothers of Asthmatics: http://www.asthmacommunitynetwork.org 4. American College of Allergy , Asthma, and Immunology: www.acaai.org      "Like" us  on Facebook and Instagram for our latest updates!      A healthy democracy works best when [employer] participate! Make sure you are registered to vote! If you have moved or changed any of your contact information, you will need to get this updated before voting! Scan the QR codes below to learn more!      Intradermal - 08/05/23 1009     Time Antigen Placed 1009    Allergen Manufacturer Jestine    Location Arm    Number of Test 13    Control 2+    [place] Negative    [place] Negative    [place] Negative    7 Grass Negative    Weed Mix 4+    Tree Mix 2+    Mold 1 2+    Mold 3 2+    Mold 4 Negative    Cat Negative    Dog Negative    Cockroach Negative          Reducing Pollen Exposure  The American Academy of Allergy , Asthma and Immunology suggests the following steps to reduce your exposure to pollen during allergy  seasons.    Do not hang sheets or clothing out to dry; pollen may collect on these items. Do not mow lawns or spend time around freshly cut  grass; mowing stirs up pollen. Keep windows closed at night.  Keep car windows closed while driving. Minimize morning activities outdoors, a time when pollen counts are usually at their highest. Stay indoors as much as possible when pollen counts or humidity is high and on windy days when pollen tends to remain in the air longer. Use air conditioning when possible.  Many air conditioners have filters that trap the pollen spores. Use a HEPA room air filter to remove pollen form the indoor air you breathe.  Control of Mold Allergen   Mold and fungi can grow on a variety of surfaces provided certain temperature and moisture  conditions exist.  Outdoor molds grow on plants, decaying vegetation and soil.  The major outdoor mold, Alternaria and Cladosporium, are found in very high numbers during hot and dry conditions.  Generally, a late Summer - Fall peak is seen for common outdoor fungal spores.  Rain will temporarily lower outdoor mold spore count, but counts rise rapidly when the rainy period ends.  The most important indoor molds are Aspergillus and Penicillium.  Dark, humid and poorly ventilated basements are ideal sites for mold growth.  The next most common sites of mold growth are the bathroom and the kitchen.  Outdoor (Seasonal) Mold Control   Use air conditioning and keep windows closed Avoid exposure to decaying vegetation. Avoid leaf raking. Avoid grain handling. Consider wearing a face mask if working in moldy areas.    Indoor (Perennial) Mold Control    Maintain humidity below 50%. Clean washable surfaces with 5% bleach solution. Remove sources e.g. contaminated carpets.    Control of Dust Mite Allergen    Dust mites play a major role in allergic asthma and rhinitis.  They occur in environments with high humidity wherever human skin is found.  Dust mites absorb humidity from the atmosphere (ie, they do not drink) and feed on organic matter (including shed human and animal skin).  Dust mites are a microscopic type of insect that you cannot see with the naked eye.  High levels of dust mites have been detected from mattresses, pillows, carpets, upholstered furniture, bed covers, clothes, soft toys and any woven material.  The principal allergen of the dust mite is found in its feces.  A gram of dust may contain 1,000 mites and 250,000 fecal particles.  Mite antigen is easily measured in the air during house cleaning activities.  Dust mites do not bite and do not cause harm to humans, other than by triggering allergies/asthma.    Ways to decrease your exposure to dust mites in your home:  Encase  mattresses, box springs and pillows with a mite-impermeable barrier or cover   Wash sheets, blankets and drapes weekly in hot water (130 F) with detergent and dry them in a dryer on the hot setting.  Have the room cleaned frequently with a vacuum cleaner and a damp dust-mop.  For carpeting or rugs, vacuuming with a vacuum cleaner equipped with a high-efficiency particulate air (HEPA) filter.  The dust mite allergic individual should not be in a room which is being cleaned and should wait 1 hour after cleaning before going into the room. Do not sleep on upholstered furniture (eg, couches).   If possible removing carpeting, upholstered furniture and drapery from the home is ideal.  Horizontal blinds should be eliminated in the rooms where the person spends the most time (bedroom, study, television room).  Washable vinyl, roller-type shades are optimal. Remove all non-washable stuffed toys from  the bedroom.  Wash stuffed toys weekly like sheets and blankets above.   Reduce indoor humidity to less than 50%.  Inexpensive humidity monitors can be purchased at most hardware stores.  Do not use a humidifier as can make the problem worse and are not recommended.  Allergy  Shots  Allergies are the result of a chain reaction that starts in the immune system. Your immune system controls how your body defends itself. For instance, if you have an allergy  to pollen, your immune system identifies pollen as an invader or allergen. Your immune system overreacts by producing antibodies called Immunoglobulin E (IgE). These antibodies travel to cells that release chemicals, causing an allergic reaction.  The concept behind allergy  immunotherapy, whether it is received in the form of shots or tablets, is that the immune system can be desensitized to specific allergens that trigger allergy  symptoms. Although it requires time and patience, the payback can be long-term relief. Allergy  injections contain a dilute solution of  those substances that you are allergic to based upon your skin testing and allergy  history.   How Do Allergy  Shots Work?  Allergy  shots work much like a vaccine. Your body responds to injected amounts of a particular allergen given in increasing doses, eventually developing a resistance and tolerance to it. Allergy  shots can lead to decreased, minimal or no allergy  symptoms.  There generally are two phases: build-up and maintenance. Build-up often ranges from three to six months and involves receiving injections with increasing amounts of the allergens. The shots are typically given once or twice a week, though more rapid build-up schedules are sometimes used.  The maintenance phase begins when the most effective dose is reached. This dose is different for each person, depending on how allergic you are and your response to the build-up injections. Once the maintenance dose is reached, there are longer periods between injections, typically two to four weeks.  Occasionally doctors give cortisone-type shots that can temporarily reduce allergy  symptoms. These types of shots are different and should not be confused with allergy  immunotherapy shots.  Who Can Be Treated with Allergy  Shots?  Allergy  shots may be a good treatment approach for people with allergic rhinitis (hay fever), allergic asthma, conjunctivitis (eye allergy ) or stinging insect allergy .   Before deciding to begin allergy  shots, you should consider:   The length of allergy  season and the severity of your symptoms  Whether medications and/or changes to your environment can control your symptoms  Your desire to avoid long-term medication use  Time: allergy  immunotherapy requires a major time commitment  Cost: may vary depending on your insurance coverage  Allergy  shots for children age 75 and older are effective and often well tolerated. They might prevent the onset of new allergen sensitivities or the progression to  asthma.  Allergy  shots are not started on patients who are pregnant but can be continued on patients who become pregnant while receiving them. In some patients with other medical conditions or who take certain common medications, allergy  shots may be of risk. It is important to mention other medications you talk to your allergist.   What are the two types of build-ups offered:   RUSH or Rapid Desensitization -- one day of injections lasting from 8:30-4:30pm, injections every 1 hour.  Approximately half of the build-up process is completed in that one day.  The following week, normal build-up is resumed, and this entails ~16 visits either weekly or twice weekly, until reaching your "maintenance dose" which is continued weekly  until eventually getting spaced out to every month for a duration of 3 to 5 years. The regular build-up appointments are nurse visits where the injections are administered, followed by required monitoring for 30 minutes.    Traditional build-up -- weekly visits for 6 -12 months until reaching "maintenance dose", then continue weekly until eventually spacing out to every 4 weeks as above. At these appointments, the injections are administered, followed by required monitoring for 30 minutes.     Either way is acceptable, and both are equally effective. With the rush protocol, the advantage is that less time is spent here for injections overall AND you would also reach maintenance dosing faster (which is when the clinical benefit starts to become more apparent). Not everyone is a candidate for rapid desensitization.   IF we proceed with the RUSH protocol, there are premedications which must be taken the day before and the day after the rush only (this includes antihistamines, steroids, and Singulair ).  After the rush day, no prednisone  or Singulair  is required, and we just recommend antihistamines taken on your injection day.  What Is An Estimate of the Costs?  If you are  interested in starting allergy  injections, please check with your insurance company about your coverage for both allergy  vial sets and allergy  injections.  Please do so prior to making the appointment to start injections.  The following are CPT codes to give to your insurance company. These are the amounts we BILL to the insurance company, but the amount YOU WILL PAY and WE RECEIVE IS SUBSTANTIALLY LESS and depends on the contracts we have with different insurance companies.   Amount Billed to Insurance One allergy  vial set  CPT 95165   $ 1200     Two allergy  vial set  CPT 95165   $ 2400     Three allergy  vial set  CPT 95165   $ 3600     One injection   CPT 95115   $ 35  Two injections   CPT 95117   $ 40 RUSH (Rapid Desensitization) CPT 95180 x 8 hours $500/hour  Regarding the allergy  injections, your co-pay may or may not apply with each injection, so please confirm this with your insurance company. When you start allergy  injections, 1 or 2 sets of vials are made based on your allergies.  Not all patients can be on one set of vials. A set of vials lasts 6 months to a year depending on how quickly you can proceed with your build-up of your allergy  injections. Vials are personalized for each patient depending on their specific allergens.  How often are allergy  injection given during the build-up period?   Injections are given at least weekly during the build-up period until your maintenance dose is achieved. Per the doctor's discretion, you may have the option of getting allergy  injections two times per week during the build-up period. However, there must be at least 48 hours between injections. The build-up period is usually completed within 6-12 months depending on your ability to schedule injections and for adjustments for reactions. When maintenance dose is reached, your injection schedule is gradually changed to every two weeks and later to every three weeks. Injections will then continue every 4  weeks. Usually, injections are continued for a total of 3-5 years.   When Will I Feel Better?  Some may experience decreased allergy  symptoms during the build-up phase. For others, it may take as long as 12 months on the maintenance dose. If there  is no improvement after a year of maintenance, your allergist will discuss other treatment options with you.  If you aren't responding to allergy  shots, it may be because there is not enough dose of the allergen in your vaccine or there are missing allergens that were not identified during your allergy  testing. Other reasons could be that there are high levels of the allergen in your environment or major exposure to non-allergic triggers like tobacco smoke.  What Is the Length of Treatment?  Once the maintenance dose is reached, allergy  shots are generally continued for three to five years. The decision to stop should be discussed with your allergist at that time. Some people may experience a permanent reduction of allergy  symptoms. Others may relapse and a longer course of allergy  shots can be considered.  What Are the Possible Reactions?  The two types of adverse reactions that can occur with allergy  shots are local and systemic. Common local reactions include very mild redness and swelling at the injection site, which can happen immediately or several hours after. Report a delayed reaction from your last injection. These include arm swelling or runny nose, watery eyes or cough that occurs within 12-24 hours after injection. A systemic reaction, which is less common, affects the entire body or a particular body system. They are usually mild and typically respond quickly to medications. Signs include increased allergy  symptoms such as sneezing, a stuffy nose or hives.   Rarely, a serious systemic reaction called anaphylaxis can develop. Symptoms include swelling in the throat, wheezing, a feeling of tightness in the chest, nausea or dizziness. Most serious  systemic reactions develop within 30 minutes of allergy  shots. This is why it is strongly recommended you wait in your doctor's office for 30 minutes after your injections. Your allergist is trained to watch for reactions, and his or her staff is trained and equipped with the proper medications to identify and treat them.   Report to the nurse immediately if you experience any of the following symptoms: swelling, itching or redness of the skin, hives, watery eyes/nose, breathing difficulty, excessive sneezing, coughing, stomach pain, diarrhea, or light headedness. These symptoms may occur within 15-20 minutes after injection and may require medication.   Who Should Administer Allergy  Shots?  The preferred location for receiving shots is your prescribing allergist's office. Injections can sometimes be given at another facility where the physician and staff are trained to recognize and treat reactions, and have received instructions by your prescribing allergist.  What if I am late for an injection?   Injection dose will be adjusted depending upon how many days or weeks you are late for your injection.   What if I am sick?   Please report any illness to the nurse before receiving injections. She may adjust your dose or postpone injections depending on your symptoms. If you have fever, flu, sinus infection or chest congestion it is best to postpone allergy  injections until you are better. Never get an allergy  injection if your asthma is causing you problems. If your symptoms persist, seek out medical care to get your health problem under control.  What If I am or Become Pregnant:  Women that become pregnant should schedule an appointment with [facility] before receiving any further allergy  injections.

## 2023-08-11 ENCOUNTER — Ambulatory Visit: Admitting: Licensed Clinical Social Worker

## 2023-08-11 DIAGNOSIS — F4325 Adjustment disorder with mixed disturbance of emotions and conduct: Secondary | ICD-10-CM

## 2023-08-11 DIAGNOSIS — F4329 Adjustment disorder with other symptoms: Secondary | ICD-10-CM | POA: Insufficient documentation
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
## 2023-08-16 DIAGNOSIS — G4734 Idiopathic sleep related nonobstructive alveolar hypoventilation: Secondary | ICD-10-CM | POA: Diagnosis not present

## 2023-08-18 DIAGNOSIS — G4700 Insomnia, unspecified: Secondary | ICD-10-CM | POA: Diagnosis not present

## 2023-08-25 ENCOUNTER — Ambulatory Visit: Admitting: Licensed Clinical Social Worker

## 2023-08-25 DIAGNOSIS — F4329 Adjustment disorder with other symptoms: Secondary | ICD-10-CM

## 2023-08-25 NOTE — Progress Notes (Unsigned)
 [facility] Counselor/Therapist Progress Note  Patient ID: Rebecca Stephens, MRN: [id number]    Date: 08/25/23  Time Spent: 1104  am - 1202 pm : 58 Minutes  Treatment Type: Individual Therapy.  Reported Symptoms: Patient is leaving her spouse of 40 years. Patient is wanting to improve her level of happiness and improve relationships.    Mental Status Exam: Appearance:  Neat       Behavior: Appropriate  Motor: Normal  Speech/Language:  Clear and Coherent  Affect: Appropriate  Mood: normal  Thought process: normal  Thought content:   WNL  Sensory/Perceptual disturbances:   WNL  Orientation: oriented to person, place, time/date, situation, day of week, month of year, and year  Attention: Good  Concentration: Good  Memory: WNL  Fund of knowledge:  Good  Insight:   Good  Judgment:  Good  Impulse Control: Good   Risk Assessment: Danger to Self:  No Self-injurious Behavior: No Danger to Others: No Duty to Warn:no Physical Aggression / Violence:No  Access to Firearms a concern: No  Gang Involvement:No   Subjective:   Rebecca Stephens participated in person from office, located at [employer]. Rebecca Stephens consented to treatment. Therapist participated from office, located at [facility].   Rebecca Stephens presented for her session in a positive mood. Rebecca Stephens reported that she bought a town home and is looking forward to moving in and being on her own. She reports that she has spoken to her son and is wanting to rebuild the relationship with he and his wife. Patient was forthcoming about having conflict with multiple people at various times. She reports wanting to be better and improve her life and her happiness. She reports that she has been so unhappy in her marriage and she hopes that this decision to leave will help her find herself and happiness. Patient shared that she doesn't want to worry about what others do and take it personal. She reports that she is  quick to voice her feelings which often worsens the situation.  Clinician actively listened and engaged in discussion with patient concerning her symptoms and her desire to problem solve and improve her situation. Clinician processed with patient doing what is best for her and not taking others opinions or actions personally. Clinician and patient also processed the idea of choosing your battles and doing so wisely. Clinician encouraged patient to focus on what is important to her and finding ways to relax which will help her find calmness and peace. This in turn can also change perspective and improve thought process.  Rebecca Stephens was fully engaged in discussion and is vulnerable in her identification of her desire to improve her overall quality of life. Patient is motivated for treatment and will utilize coping skills to assist with improving her symptoms of depression and anxiety related to her current life situation. Rebecca Stephens will continue to engage in bi weekly therapy sessions. Treatment planning to be reviewed by 08/10/2024.  Interventions: Cognitive Behavioral Therapy, Assertiveness/Communication, Motivational Interviewing, and Solution-Oriented/Positive Psychology  Diagnosis: Adjustment Disorder with mixed emotional features   Damien Junk MSW, LCSW/DATE 08/25/2023

## 2023-08-31 DIAGNOSIS — J4540 Moderate persistent asthma, uncomplicated: Secondary | ICD-10-CM | POA: Diagnosis not present

## 2023-08-31 DIAGNOSIS — G4734 Idiopathic sleep related nonobstructive alveolar hypoventilation: Secondary | ICD-10-CM | POA: Diagnosis not present

## 2023-09-08 DIAGNOSIS — Z471 Aftercare following joint replacement surgery: Secondary | ICD-10-CM | POA: Diagnosis not present

## 2023-09-08 DIAGNOSIS — M1612 Unilateral primary osteoarthritis, left hip: Secondary | ICD-10-CM | POA: Diagnosis not present

## 2023-09-08 DIAGNOSIS — Z96641 Presence of right artificial hip joint: Secondary | ICD-10-CM | POA: Diagnosis not present

## 2023-09-13 ENCOUNTER — Ambulatory Visit: Admitting: Licensed Clinical Social Worker

## 2023-09-13 DIAGNOSIS — F4325 Adjustment disorder with mixed disturbance of emotions and conduct: Secondary | ICD-10-CM

## 2023-09-13 DIAGNOSIS — F4329 Adjustment disorder with other symptoms: Secondary | ICD-10-CM

## 2023-09-13 NOTE — Progress Notes (Signed)
 [facility] Counselor/Therapist Progress Note  Patient ID: Rebecca Stephens, MRN: [id number]    Date: 09/13/23  Time Spent: 0203  pm - 0300 pm : 57 Minutes  Treatment Type: Individual Therapy.  Reported Symptoms: Patient is leaving her spouse of 40 years. Patient is wanting to improve her level of happiness and improve relationships.     Mental Status Exam: Appearance:  Neat       Behavior: Appropriate  Motor: Normal  Speech/Language:  Clear and Coherent  Affect: Appropriate  Mood: normal  Thought process: normal  Thought content:   WNL  Sensory/Perceptual disturbances:   WNL  Orientation: oriented to person, place, time/date, situation, day of week, month of year, and year  Attention: Good  Concentration: Good  Memory: WNL  Fund of knowledge:  Good  Insight:   Good  Judgment:  Good  Impulse Control: Good    Risk Assessment: Danger to Self:  No Self-injurious Behavior: No Danger to Others: No Duty to Warn:no Physical Aggression / Violence:No  Access to Firearms a concern: No  Gang Involvement:No    Subjective:    Rebecca Stephens participated in person from office, located at [employer]. Bintou consented to treatment. Therapist participated from office, located at [facility].   Rebecca Stephens presented for her session identifying her weekend and getting to see her son in [place] and speaking with his wife. She reports that she was able to clear things up and felt as though they had a positive conversation. She states that she feels better about the situation as well as about herself and how she handled the situation. Rebecca Stephens reported that she is also going to [place]  to visit her other son and his girlfriend this week. She states she is excited for the trip. Rebecca Stephens reports that she remains concerned about her anger and that she appears to have conflict with many people. She states that she wants to work on that and find out what has  triggered this behavior.  Clinician actively listened and provided support and feedback. Clinician processed with patient her concerns and provided praise for patients problem solving with her daughter in law. Clinician processed with patient her anger and frequent conflict with others. Clinician pointed out several factors to consider, such as stress, anxiety and depression, of which all can cause irritability and frequent mood swings. Clinician encouraged patient to make note of her frustrations and to write down some of the precipitating factors, so she can get a better understanding of her triggers.  Rebecca Stephens was fully engaged in discussion and was pleasant and cooperative. Rebecca Stephens is motivated for treatment as evidenced by her desire for change. Rebecca Stephens will use CBT, mindfulness and coping skills to help manage decrease symptoms associated with their diagnosis. Rebecca Stephens will continue to engage in bi weekly therapy sessions. Treatment planning to be reviewed by 08/10/2024.  Interventions: Cognitive Behavioral Therapy, Dialectical Behavioral Therapy, Assertiveness/Communication, Motivational Interviewing, and Solution-Oriented/Positive Psychology  Diagnosis: Adjustment Disorder with mixed emotional features   Damien Junk MSW, LCSW/DATE 09/13/2023

## 2023-10-05 ENCOUNTER — Ambulatory Visit: Admitting: Licensed Clinical Social Worker

## 2023-10-14 ENCOUNTER — Ambulatory Visit: Admitting: Licensed Clinical Social Worker

## 2023-11-09 ENCOUNTER — Ambulatory Visit: Admitting: Allergy & Immunology

## 2023-12-02 ENCOUNTER — Ambulatory Visit: Admitting: Allergy & Immunology

## 2023-12-06 ENCOUNTER — Telehealth: Payer: Self-pay

## 2023-12-06 MED ORDER — BREZTRI AEROSPHERE 160-9-4.8 MCG/ACT IN AERO: 2.0000 | INHALATION_SPRAY | Freq: Every day | RESPIRATORY_TRACT | 11 refills | Status: DC

## 2023-12-06 MED ORDER — MONTELUKAST SODIUM 10 MG PO TABS: 10.0000 mg | ORAL_TABLET | Freq: Every day | ORAL | 5 refills | Status: AC

## 2023-12-06 NOTE — Telephone Encounter (Signed)
 Received fax request to refill breztri ,refill sent

## 2023-12-06 NOTE — Telephone Encounter (Signed)
 Received fax request to refill singulair ,refill sent

## 2023-12-14 ENCOUNTER — Encounter: Payer: Self-pay | Admitting: Allergy & Immunology

## 2023-12-14 MED ORDER — BREZTRI AEROSPHERE 160-9-4.8 MCG/ACT IN AERO: 2.0000 | INHALATION_SPRAY | Freq: Every day | RESPIRATORY_TRACT | 11 refills | Status: AC

## 2023-12-16 ENCOUNTER — Ambulatory Visit: Admitting: Allergy & Immunology
